# Patient Record
Sex: Female | Born: 1950 | Race: White | Hispanic: No | Marital: Married | Smoking: Never smoker
Health system: Southern US, Community
[De-identification: ages and names within clinical notes are randomized; demographics above are authoritative.]

## PROBLEM LIST (undated history)

## (undated) DIAGNOSIS — E7800 Pure hypercholesterolemia, unspecified: Secondary | ICD-10-CM

## (undated) DIAGNOSIS — Z8489 Family history of other specified conditions: Secondary | ICD-10-CM

## (undated) DIAGNOSIS — N816 Rectocele: Secondary | ICD-10-CM

## (undated) DIAGNOSIS — I447 Left bundle-branch block, unspecified: Secondary | ICD-10-CM

## (undated) DIAGNOSIS — I10 Essential (primary) hypertension: Secondary | ICD-10-CM

## (undated) DIAGNOSIS — N814 Uterovaginal prolapse, unspecified: Secondary | ICD-10-CM

## (undated) DIAGNOSIS — D219 Benign neoplasm of connective and other soft tissue, unspecified: Secondary | ICD-10-CM

## (undated) HISTORY — DX: Essential (primary) hypertension: I10

## (undated) HISTORY — DX: Pure hypercholesterolemia, unspecified: E78.00

## (undated) HISTORY — PX: VAGINAL HYSTERECTOMY: SUR661

## (undated) HISTORY — DX: Uterovaginal prolapse, unspecified: N81.4

## (undated) HISTORY — PX: BLADDER SUSPENSION: SHX72

## (undated) HISTORY — DX: Rectocele: N81.6

## (undated) HISTORY — PX: PELVIC LAPAROSCOPY: SHX162

## (undated) HISTORY — DX: Benign neoplasm of connective and other soft tissue, unspecified: D21.9

---

## 1997-09-15 ENCOUNTER — Other Ambulatory Visit: Admission: RE | Admit: 1997-09-15 | Discharge: 1997-09-15 | Payer: Self-pay | Admitting: Obstetrics and Gynecology

## 1997-10-13 ENCOUNTER — Ambulatory Visit (HOSPITAL_COMMUNITY): Admission: RE | Admit: 1997-10-13 | Discharge: 1997-10-13 | Payer: Self-pay | Admitting: Obstetrics and Gynecology

## 1999-03-08 ENCOUNTER — Ambulatory Visit (HOSPITAL_COMMUNITY): Admission: RE | Admit: 1999-03-08 | Discharge: 1999-03-08 | Payer: Self-pay | Admitting: Obstetrics and Gynecology

## 1999-03-08 ENCOUNTER — Encounter: Payer: Self-pay | Admitting: Obstetrics and Gynecology

## 1999-04-05 ENCOUNTER — Other Ambulatory Visit: Admission: RE | Admit: 1999-04-05 | Discharge: 1999-04-05 | Payer: Self-pay | Admitting: Obstetrics and Gynecology

## 1999-06-30 ENCOUNTER — Ambulatory Visit (HOSPITAL_COMMUNITY): Admission: RE | Admit: 1999-06-30 | Discharge: 1999-06-30 | Payer: Self-pay | Admitting: Cardiology

## 2000-04-17 ENCOUNTER — Other Ambulatory Visit: Admission: RE | Admit: 2000-04-17 | Discharge: 2000-04-17 | Payer: Self-pay | Admitting: Obstetrics and Gynecology

## 2000-07-25 ENCOUNTER — Ambulatory Visit (HOSPITAL_COMMUNITY): Admission: RE | Admit: 2000-07-25 | Discharge: 2000-07-25 | Payer: Self-pay | Admitting: Obstetrics and Gynecology

## 2000-07-25 ENCOUNTER — Encounter: Payer: Self-pay | Admitting: Obstetrics and Gynecology

## 2000-12-19 ENCOUNTER — Encounter: Admission: RE | Admit: 2000-12-19 | Discharge: 2001-03-19 | Payer: Self-pay | Admitting: Family Medicine

## 2001-04-17 ENCOUNTER — Emergency Department (HOSPITAL_COMMUNITY): Admission: EM | Admit: 2001-04-17 | Discharge: 2001-04-18 | Payer: Self-pay | Admitting: Emergency Medicine

## 2001-04-25 ENCOUNTER — Other Ambulatory Visit: Admission: RE | Admit: 2001-04-25 | Discharge: 2001-04-25 | Payer: Self-pay | Admitting: Obstetrics and Gynecology

## 2001-12-23 ENCOUNTER — Encounter: Payer: Self-pay | Admitting: Obstetrics and Gynecology

## 2001-12-23 ENCOUNTER — Ambulatory Visit (HOSPITAL_COMMUNITY): Admission: RE | Admit: 2001-12-23 | Discharge: 2001-12-23 | Payer: Self-pay | Admitting: Obstetrics and Gynecology

## 2002-12-23 ENCOUNTER — Encounter (INDEPENDENT_AMBULATORY_CARE_PROVIDER_SITE_OTHER): Payer: Self-pay | Admitting: *Deleted

## 2002-12-23 ENCOUNTER — Ambulatory Visit (HOSPITAL_COMMUNITY): Admission: RE | Admit: 2002-12-23 | Discharge: 2002-12-23 | Payer: Self-pay | Admitting: Gastroenterology

## 2003-02-11 ENCOUNTER — Ambulatory Visit (HOSPITAL_COMMUNITY): Admission: RE | Admit: 2003-02-11 | Discharge: 2003-02-11 | Payer: Self-pay | Admitting: Obstetrics and Gynecology

## 2003-02-11 ENCOUNTER — Other Ambulatory Visit: Admission: RE | Admit: 2003-02-11 | Discharge: 2003-02-11 | Payer: Self-pay | Admitting: Obstetrics and Gynecology

## 2004-02-22 ENCOUNTER — Other Ambulatory Visit: Admission: RE | Admit: 2004-02-22 | Discharge: 2004-02-22 | Payer: Self-pay | Admitting: Obstetrics and Gynecology

## 2004-02-24 ENCOUNTER — Ambulatory Visit (HOSPITAL_COMMUNITY): Admission: RE | Admit: 2004-02-24 | Discharge: 2004-02-24 | Payer: Self-pay | Admitting: Obstetrics and Gynecology

## 2004-03-10 ENCOUNTER — Encounter: Admission: RE | Admit: 2004-03-10 | Discharge: 2004-03-10 | Payer: Self-pay | Admitting: Obstetrics and Gynecology

## 2005-02-22 ENCOUNTER — Other Ambulatory Visit: Admission: RE | Admit: 2005-02-22 | Discharge: 2005-02-22 | Payer: Self-pay | Admitting: Obstetrics and Gynecology

## 2005-02-24 ENCOUNTER — Encounter: Admission: RE | Admit: 2005-02-24 | Discharge: 2005-02-24 | Payer: Self-pay | Admitting: Obstetrics and Gynecology

## 2006-03-16 ENCOUNTER — Other Ambulatory Visit: Admission: RE | Admit: 2006-03-16 | Discharge: 2006-03-16 | Payer: Self-pay | Admitting: Obstetrics and Gynecology

## 2006-03-29 ENCOUNTER — Encounter: Admission: RE | Admit: 2006-03-29 | Discharge: 2006-03-29 | Payer: Self-pay | Admitting: Obstetrics and Gynecology

## 2007-04-17 ENCOUNTER — Encounter: Admission: RE | Admit: 2007-04-17 | Discharge: 2007-04-17 | Payer: Self-pay | Admitting: Obstetrics and Gynecology

## 2008-04-15 ENCOUNTER — Encounter: Payer: Self-pay | Admitting: Obstetrics and Gynecology

## 2008-04-15 ENCOUNTER — Other Ambulatory Visit: Admission: RE | Admit: 2008-04-15 | Discharge: 2008-04-15 | Payer: Self-pay | Admitting: Obstetrics and Gynecology

## 2008-04-15 ENCOUNTER — Ambulatory Visit: Payer: Self-pay | Admitting: Obstetrics and Gynecology

## 2008-04-20 ENCOUNTER — Encounter: Admission: RE | Admit: 2008-04-20 | Discharge: 2008-04-20 | Payer: Self-pay | Admitting: Obstetrics and Gynecology

## 2008-05-04 ENCOUNTER — Ambulatory Visit: Payer: Self-pay | Admitting: Obstetrics and Gynecology

## 2009-04-21 ENCOUNTER — Encounter: Admission: RE | Admit: 2009-04-21 | Discharge: 2009-04-21 | Payer: Self-pay | Admitting: Obstetrics and Gynecology

## 2009-06-23 ENCOUNTER — Other Ambulatory Visit: Admission: RE | Admit: 2009-06-23 | Discharge: 2009-06-23 | Payer: Self-pay | Admitting: Obstetrics and Gynecology

## 2009-06-23 ENCOUNTER — Ambulatory Visit: Payer: Self-pay | Admitting: Obstetrics and Gynecology

## 2009-06-24 ENCOUNTER — Ambulatory Visit: Payer: Self-pay | Admitting: Obstetrics and Gynecology

## 2009-09-15 ENCOUNTER — Ambulatory Visit: Payer: Self-pay | Admitting: Obstetrics and Gynecology

## 2009-09-23 ENCOUNTER — Ambulatory Visit (HOSPITAL_BASED_OUTPATIENT_CLINIC_OR_DEPARTMENT_OTHER): Admission: RE | Admit: 2009-09-23 | Discharge: 2009-09-24 | Payer: Self-pay | Admitting: Obstetrics and Gynecology

## 2010-03-24 LAB — GLUCOSE, CAPILLARY
Glucose-Capillary: 104 mg/dL — ABNORMAL HIGH (ref 70–99)
Glucose-Capillary: 121 mg/dL — ABNORMAL HIGH (ref 70–99)
Glucose-Capillary: 138 mg/dL — ABNORMAL HIGH (ref 70–99)
Glucose-Capillary: 152 mg/dL — ABNORMAL HIGH (ref 70–99)

## 2010-03-24 LAB — POCT I-STAT 4, (NA,K, GLUC, HGB,HCT)
Glucose, Bld: 131 mg/dL — ABNORMAL HIGH (ref 70–99)
HCT: 41 % (ref 36.0–46.0)
Hemoglobin: 13.9 g/dL (ref 12.0–15.0)
Potassium: 4 mEq/L (ref 3.5–5.1)
Sodium: 142 mEq/L (ref 135–145)

## 2010-05-27 NOTE — Cardiovascular Report (Signed)
[place]. [facility]  Patient:    Angela Choi, Angela Choi                        MRN: [id number] Proc. Date: 06/30/99 Adm. Date:  [id number] Disc. Date: [id number] Attending:  [place], Peter Manning CC:         Gabriel Earing, M.D.             Cath Lab and Medical records                        Cardiac Catheterization  PROCEDURE:  Cardiac catheterization.  INDICATION:  The patient is a 60 year old white female with a history of hypercholesterolemia and family history of coronary disease.  She had some atypical chest tightness.  Resting ECG was abnormal and subsequent stress testing demonstrated intermittent left bundle branch block.  She had some evidence of anterior ischemia.  Access via the right femoral artery using standard Seldinger technique.  INSTRUMENTS:  A 6 French 4 cm right and left Judkins catheter, a 6 French pigtail catheter, 6 French arterial sheath.  PREMEDICATION:  Local anesthesia with 1% Xylocaine, Versed 2 mg IV.  CONTRAST:  150 cc of Omnipaque.  HEMODYNAMIC DATA: 1. Aortic pressure is 131/81 with a mean of 104 mmHg. 2. Left ventricular pressure is 133 with an EDP of 20 mmHg.  ANGIOGRAPHIC DATA: 1. Left coronary artery rises in a dominant fashion. 2. Left main coronary artery is normal. 3. Left anterior descending coronary artery is a large vessel normal. 4. Left circumflex coronary artery is a large dominant vessel and is also    normal. 5. The right coronary artery is a small nondominant vessel and is normal.  LEFT VENTRICULAR ANGIOGRAPHY:  Left ventricular angiography was performed in the RAO view.  Left ventricle was small in size with normal contractility. Ejection fraction is estimated at 70%.  There is no significant mitral insufficiency or prolapse.  FINAL INTERPRETATION: 1. Normal coronary anatomy. 2. Normal left ventricular function. DD:  06/30/99 TD:  07/03/99 Job: [id number]

## 2010-05-27 NOTE — Op Note (Signed)
NAME:  Angela Choi, Angela Choi [facility]                         ACCOUNT NO.:  [id number]   MEDICAL RECORD NO.:  [id number]                   PATIENT TYPE:  AMB   LOCATION:  ENDO                                 FACILITY:  [facility]   PHYSICIAN:  Graylin Shiver, M.D.                DATE OF BIRTH:  22-Aug-1950   DATE OF PROCEDURE:  12/23/2002  DATE OF DISCHARGE:                                 OPERATIVE REPORT   PROCEDURE:  Colonoscopy with biopsy.   INDICATIONS:  Screening.   Informed consent was obtained after explanation of the risks of bleeding,  infection, and perforation.   PREMEDICATION:  Fentanyl 90 mcg IV, Versed 9 mg IV.   DESCRIPTION OF PROCEDURE:  With the patient in the left lateral decubitus  position, a rectal exam was performed and no masses were felt.  The Olympus  colonoscope was inserted into the rectum and advanced around the colon to  the cecum.  Cecal landmarks were identified.  The cecum and ascending colon  were normal.  The transverse colon was normal.  The descending colon was  normal.  In the sigmoid there was a small 3 mm sessile polyp removed with  cold forceps.  In the rectum there were two small 3 mm polyps removed with  cold forceps.  She tolerated the procedure well without complications.   IMPRESSION:  Small polyps in the rectum and sigmoid.   PLAN:  Pathology will be checked.                                               Graylin Shiver, M.D.    SFG/MEDQ  D:  12/23/2002  T:  12/23/2002  Job:  [id number]   cc:   Chales Salmon. Abigail Miyamoto, M.D.  [address]  [place]  [place] [address]  Fax: [phone]

## 2010-06-15 ENCOUNTER — Other Ambulatory Visit: Payer: Self-pay | Admitting: Obstetrics and Gynecology

## 2010-06-15 DIAGNOSIS — Z1231 Encounter for screening mammogram for malignant neoplasm of breast: Secondary | ICD-10-CM

## 2010-06-22 ENCOUNTER — Ambulatory Visit
Admission: RE | Admit: 2010-06-22 | Discharge: 2010-06-22 | Disposition: A | Discharge status: Home / Self Care | Payer: 59 | Source: Ambulatory Visit | Attending: Obstetrics and Gynecology | Admitting: Obstetrics and Gynecology

## 2010-06-22 DIAGNOSIS — Z1231 Encounter for screening mammogram for malignant neoplasm of breast: Secondary | ICD-10-CM

## 2010-06-27 ENCOUNTER — Other Ambulatory Visit (HOSPITAL_COMMUNITY)
Admission: RE | Admit: 2010-06-27 | Discharge: 2010-06-27 | Disposition: A | Discharge status: Home / Self Care | Payer: 59 | Source: Ambulatory Visit | Attending: Obstetrics and Gynecology | Admitting: Obstetrics and Gynecology

## 2010-06-27 ENCOUNTER — Other Ambulatory Visit: Payer: Self-pay | Admitting: Obstetrics and Gynecology

## 2010-06-27 ENCOUNTER — Encounter (INDEPENDENT_AMBULATORY_CARE_PROVIDER_SITE_OTHER): Payer: 59 | Admitting: Obstetrics and Gynecology

## 2010-06-27 DIAGNOSIS — R82998 Other abnormal findings in urine: Secondary | ICD-10-CM

## 2010-06-27 DIAGNOSIS — Z01419 Encounter for gynecological examination (general) (routine) without abnormal findings: Secondary | ICD-10-CM

## 2010-06-27 DIAGNOSIS — Z124 Encounter for screening for malignant neoplasm of cervix: Secondary | ICD-10-CM | POA: Insufficient documentation

## 2010-10-20 ENCOUNTER — Other Ambulatory Visit: Payer: Self-pay | Admitting: *Deleted

## 2010-10-20 MED ORDER — ESTRADIOL 0.05 MG/24HR TD PTWK: 1.0000 | MEDICATED_PATCH | TRANSDERMAL | Status: DC

## 2010-10-20 NOTE — Telephone Encounter (Signed)
Patient requested 90 day rx for generic Climara 0.05 patch.  [facility]

## 2010-12-12 ENCOUNTER — Other Ambulatory Visit: Payer: Self-pay | Admitting: Dermatology

## 2011-06-08 ENCOUNTER — Other Ambulatory Visit: Payer: Self-pay | Admitting: Obstetrics and Gynecology

## 2011-06-08 DIAGNOSIS — Z1231 Encounter for screening mammogram for malignant neoplasm of breast: Secondary | ICD-10-CM

## 2011-06-20 ENCOUNTER — Encounter: Payer: Self-pay | Admitting: Gynecology

## 2011-06-20 DIAGNOSIS — N816 Rectocele: Secondary | ICD-10-CM | POA: Insufficient documentation

## 2011-06-20 DIAGNOSIS — N814 Uterovaginal prolapse, unspecified: Secondary | ICD-10-CM | POA: Insufficient documentation

## 2011-06-20 DIAGNOSIS — I10 Essential (primary) hypertension: Secondary | ICD-10-CM | POA: Insufficient documentation

## 2011-06-20 DIAGNOSIS — E7800 Pure hypercholesterolemia, unspecified: Secondary | ICD-10-CM | POA: Insufficient documentation

## 2011-06-20 DIAGNOSIS — E119 Type 2 diabetes mellitus without complications: Secondary | ICD-10-CM | POA: Insufficient documentation

## 2011-06-20 DIAGNOSIS — N809 Endometriosis, unspecified: Secondary | ICD-10-CM | POA: Insufficient documentation

## 2011-06-20 DIAGNOSIS — D219 Benign neoplasm of connective and other soft tissue, unspecified: Secondary | ICD-10-CM | POA: Insufficient documentation

## 2011-06-28 ENCOUNTER — Ambulatory Visit (INDEPENDENT_AMBULATORY_CARE_PROVIDER_SITE_OTHER): Payer: BC Managed Care – PPO | Admitting: Obstetrics and Gynecology

## 2011-06-28 ENCOUNTER — Ambulatory Visit
Admission: RE | Admit: 2011-06-28 | Discharge: 2011-06-28 | Disposition: A | Discharge status: Home / Self Care | Payer: BC Managed Care – PPO | Source: Ambulatory Visit | Attending: Obstetrics and Gynecology | Admitting: Obstetrics and Gynecology

## 2011-06-28 ENCOUNTER — Encounter: Payer: Self-pay | Admitting: Obstetrics and Gynecology

## 2011-06-28 VITALS — BP 138/90 | Ht 65.0 in | Wt 189.0 lb

## 2011-06-28 DIAGNOSIS — Z1231 Encounter for screening mammogram for malignant neoplasm of breast: Secondary | ICD-10-CM

## 2011-06-28 DIAGNOSIS — Z01419 Encounter for gynecological examination (general) (routine) without abnormal findings: Secondary | ICD-10-CM

## 2011-06-28 MED ORDER — ESTRADIOL 0.05 MG/24HR TD PTWK: 1.0000 | MEDICATED_PATCH | TRANSDERMAL | Status: DC

## 2011-06-28 NOTE — Progress Notes (Signed)
Patient came to see me today for her annual GYN exam. She has scheduled her mammogram. She has had 2 normal bone densities. She remains on a Climara patch which is helping her hot flashes. She is having no vaginal bleeding. She is having no pelvic pain. She just has occasional stress incontinence since she had her sling procedure. She does her lab with her PCP. In 1982 she had cervical dysplasia which was observed and resolved. She is had normal Paps since then. Her last Pap was in June of 2012. She has had a vaginal hysterectomy for uterine prolapse,  Fibroids,  and endometriosis. She has both her ovaries.  HEENT: Within normal limits. Blanca present. Neck: No masses. Supraclavicular lymph nodes: Not enlarged. Breasts: Examined in both sitting and lying position. Symmetrical without skin changes or masses. Abdomen: Soft no masses guarding or rebound. No hernias. Pelvic: External within normal limits. BUS within normal limits. Vaginal examination shows good estrogen effect, no cystocele enterocele or rectocele. Her sling is palpable on the anterior wall vagina. Cervix and uterus absent. Adnexa within normal limits. Rectovaginal confirmatory. Extremities within normal limits.  Assessment: Vasomotor symptoms. History of CIN  in 1982.  Plan: Mammogram. Continue Climara patch. Discussed with her that new guidelines suggest she does not need any more Pap smears. No Pap done today.

## 2011-07-04 ENCOUNTER — Encounter: Payer: Self-pay | Admitting: Obstetrics and Gynecology

## 2012-06-13 ENCOUNTER — Other Ambulatory Visit: Payer: Self-pay

## 2012-06-13 DIAGNOSIS — Z1231 Encounter for screening mammogram for malignant neoplasm of breast: Secondary | ICD-10-CM

## 2012-07-01 ENCOUNTER — Other Ambulatory Visit: Payer: Self-pay | Admitting: Obstetrics and Gynecology

## 2012-07-03 ENCOUNTER — Ambulatory Visit (INDEPENDENT_AMBULATORY_CARE_PROVIDER_SITE_OTHER): Payer: BC Managed Care – PPO | Admitting: Gynecology

## 2012-07-03 ENCOUNTER — Encounter: Payer: Self-pay | Admitting: Gynecology

## 2012-07-03 VITALS — BP 138/78 | Ht 65.25 in | Wt 178.0 lb

## 2012-07-03 DIAGNOSIS — N951 Menopausal and female climacteric states: Secondary | ICD-10-CM

## 2012-07-03 DIAGNOSIS — Z780 Asymptomatic menopausal state: Secondary | ICD-10-CM

## 2012-07-03 DIAGNOSIS — Z01419 Encounter for gynecological examination (general) (routine) without abnormal findings: Secondary | ICD-10-CM

## 2012-07-03 DIAGNOSIS — Z79890 Hormone replacement therapy (postmenopausal): Secondary | ICD-10-CM

## 2012-07-03 MED ORDER — ESTRADIOL 0.05 MG/24HR TD PTWK: 0.0500 | MEDICATED_PATCH | TRANSDERMAL | Status: DC

## 2012-07-03 NOTE — Progress Notes (Signed)
Angela Choi 11/15/1950 [id number]   History:    62 y.o.  for annual gyn exam no complaints. The patient has been on transdermal estrogen consisting of Climara patch 0.05 mg/24-hour.  Patient with past history of transvaginal hysterectomy with posterior repair in 1995 for uterine prolapse and fibroid uterus. In 2011 she had a suburethral sling placed as well. She denied any urinary incontinence today. The patient had cervical dysplasia in 1982 and subsequent Pap smears have been normal since then. She also has a history of benign colon polyps in the past and her last colonoscopy was in 2010. She is overdue for mammogram which she is scheduled for the next 2 weeks. She frequent does her breast exams. She states that her primary physician Dr. Sigmund Hazel has been doing her blood work and that her shingles vaccine, Pneumovax vaccine antidiabetic seen are all up-to-date. She is exercising regularly and better diet and has lost 11 pounds since last year.  Past medical history,surgical history, family history and social history were all reviewed and documented in the EPIC chart.  Gynecologic History No LMP recorded. Patient has had a hysterectomy. Contraception: status post hysterectomy Last Pap: 2012. Results were: normal Last mammogram: 2013. Results were: normal  Obstetric History OB History   Grav Para Term Preterm Abortions TAB SAB Ect Mult Living   2 2 2       2      # Outc Date GA Lbr Len/2nd Wgt Sex Del Anes PTL Lv   1 TRM            2 TRM                ROS: A ROS was performed and pertinent positives and negatives are included in the history.  GENERAL: No fevers or chills. HEENT: No change in vision, no earache, sore throat or sinus congestion. NECK: No pain or stiffness. CARDIOVASCULAR: No chest pain or pressure. No palpitations. PULMONARY: No shortness of breath, cough or wheeze. GASTROINTESTINAL: No abdominal pain, nausea, vomiting or diarrhea, melena or bright red blood per rectum.  GENITOURINARY: No urinary frequency, urgency, hesitancy or dysuria. MUSCULOSKELETAL: No joint or muscle pain, no back pain, no recent trauma. DERMATOLOGIC: No rash, no itching, no lesions. ENDOCRINE: No polyuria, polydipsia, no heat or cold intolerance. No recent change in weight. HEMATOLOGICAL: No anemia or easy bruising or bleeding. NEUROLOGIC: No headache, seizures, numbness, tingling or weakness. PSYCHIATRIC: No depression, no loss of interest in normal activity or change in sleep pattern.     Exam: chaperone present  BP 138/78  Ht 5' 5.25" (1.657 m)  Wt 178 lb (80.74 kg)  BMI 29.41 kg/m2  Body mass index is 29.41 kg/(m^2).  General appearance : Well developed well nourished female. No acute distress HEENT: Neck supple, trachea midline, no carotid bruits, no thyroidmegaly Lungs: Clear to auscultation, no rhonchi or wheezes, or rib retractions  Heart: Regular rate and rhythm, no murmurs or gallops Breast:Examined in sitting and supine position were symmetrical in appearance, no palpable masses or tenderness,  no skin retraction, no nipple inversion, no nipple discharge, no skin discoloration, no axillary or supraclavicular lymphadenopathy Abdomen: no palpable masses or tenderness, no rebound or guarding Extremities: no edema or skin discoloration or tenderness  Pelvic:  Bartholin, Urethra, Skene Glands: Within normal limits             Vagina: No gross lesions or discharge  Cervix:absent  Uterus absent  Adnexa  Without masses or tenderness  Anus and  perineum  normal   Rectovaginal  normal sphincter tone without palpated masses or tenderness             Hemoccult course provided     Assessment/Plan:  62 y.o. female for annual exam doing well on Climara patch 0.05 mg q. Weekly refill provided. Patient has been on HRT less than 10 years. We discussed the women's health initiative study and the risk benefits and pros and cons of hormone replacement therapy. She was reminded to  followup with her schedule mammogram and to continue to do her monthly breast exams. We discussed importance of calcium and vitamin D for osteoporosis prevention. She will schedule her bone density study here in our office the next 2 weeks. She was reminded to submit to the office Hemoccult card for testing. No Pap smear done today the new guidelines were discussed.    Ok Edwards MD, 5:28 PM 07/03/2012

## 2012-07-03 NOTE — Patient Instructions (Addendum)
Bone Densitometry Bone densitometry is a special X-ray that measures your bone density and can be used to help predict your risk of bone fractures. This test is used to determine bone mineral content and density to diagnose osteoporosis. Osteoporosis is the loss of bone that may cause the bone to become weak. Osteoporosis commonly occurs in women entering menopause. However, it may be found in men and in people with other diseases. PREPARATION FOR TEST No preparation necessary. WHO SHOULD BE TESTED?  All women older than 65.  Postmenopausal women (50 to 65) with risk factors for osteoporosis.  People with a previous fracture caused by normal activities.  People with a small body frame (less than 127 poundsor a body mass index [BMI] of less than 21).  People who have a parent with a hip fracture or history of osteoporosis.  People who smoke.  People who have rheumatoid arthritis.  Anyone who engages in excessive alcohol use (more than 3 drinks most days).  Women who experience early menopause. WHEN SHOULD YOU BE RETESTED? Current guidelines suggest that you should wait at least 2 years before doing a bone density test again if your first test was normal.Recent studies indicated that women with normal bone density may be able to wait a few years before needing to repeat a bone density test. You should discuss this with your caregiver.  NORMAL FINDINGS   Normal: less than standard deviation below normal (greater than -1).  Osteopenia: 1 to 2.5 standard deviations below normal (-1 to -2.5).  Osteoporosis: greater than 2.5 standard deviations below normal (less than -2.5). Test results are reported as a "T score" and a "Z score."The T score is a number that compares your bone density with the bone density of healthy, young women.The Z score is a number that compares your bone density with the scores of women who are the same age, gender, and race.  Ranges for normal findings may vary  among different laboratories and hospitals. You should always check with your doctor after having lab work or other tests done to discuss the meaning of your test results and whether your values are considered within normal limits. MEANING OF TEST  Your caregiver will go over the test results with you and discuss the importance and meaning of your results, as well as treatment options and the need for additional tests if necessary. OBTAINING THE TEST RESULTS It is your responsibility to obtain your test results. Ask the lab or department performing the test when and how you will get your results. Document Released: 01/18/2004 Document Revised: 03/20/2011 Document Reviewed: 02/09/2010 [employer] Patient Information 2014 [employer].  

## 2012-07-04 ENCOUNTER — Encounter: Payer: Self-pay | Admitting: Obstetrics and Gynecology

## 2012-07-08 ENCOUNTER — Other Ambulatory Visit: Payer: Self-pay | Admitting: Anesthesiology

## 2012-07-08 DIAGNOSIS — Z1211 Encounter for screening for malignant neoplasm of colon: Secondary | ICD-10-CM

## 2012-07-09 ENCOUNTER — Ambulatory Visit
Admission: RE | Admit: 2012-07-09 | Discharge: 2012-07-09 | Disposition: A | Discharge status: Home / Self Care | Payer: BC Managed Care – PPO | Source: Ambulatory Visit

## 2012-07-09 DIAGNOSIS — Z1231 Encounter for screening mammogram for malignant neoplasm of breast: Secondary | ICD-10-CM

## 2012-07-16 ENCOUNTER — Encounter: Payer: Self-pay | Admitting: Obstetrics and Gynecology

## 2012-08-08 ENCOUNTER — Ambulatory Visit (INDEPENDENT_AMBULATORY_CARE_PROVIDER_SITE_OTHER): Payer: BC Managed Care – PPO

## 2012-08-08 DIAGNOSIS — Z79890 Hormone replacement therapy (postmenopausal): Secondary | ICD-10-CM

## 2012-08-08 DIAGNOSIS — Z780 Asymptomatic menopausal state: Secondary | ICD-10-CM

## 2012-08-08 DIAGNOSIS — Z13820 Encounter for screening for osteoporosis: Secondary | ICD-10-CM

## 2013-02-04 ENCOUNTER — Telehealth: Payer: Self-pay | Admitting: *Deleted

## 2013-02-04 MED ORDER — ESTRADIOL 0.05 MG/24HR TD PTWK: 0.0500 mg | MEDICATED_PATCH | TRANSDERMAL | Status: DC

## 2013-02-04 NOTE — Telephone Encounter (Signed)
Pt has new mail order pharmacy and would like refill climara patch sent there. 3 month supply with 1 refill sent.

## 2013-06-04 ENCOUNTER — Other Ambulatory Visit: Payer: Self-pay

## 2013-06-04 DIAGNOSIS — Z1231 Encounter for screening mammogram for malignant neoplasm of breast: Secondary | ICD-10-CM

## 2013-07-04 ENCOUNTER — Encounter: Payer: Self-pay | Admitting: Gynecology

## 2013-07-04 ENCOUNTER — Ambulatory Visit (INDEPENDENT_AMBULATORY_CARE_PROVIDER_SITE_OTHER): Payer: BC Managed Care – PPO | Admitting: Gynecology

## 2013-07-04 VITALS — BP 130/78 | Ht 65.5 in | Wt 181.0 lb

## 2013-07-04 DIAGNOSIS — Z79890 Hormone replacement therapy (postmenopausal): Secondary | ICD-10-CM

## 2013-07-04 DIAGNOSIS — Z01419 Encounter for gynecological examination (general) (routine) without abnormal findings: Secondary | ICD-10-CM

## 2013-07-04 MED ORDER — ESTRADIOL 0.05 MG/24HR TD PTWK: 0.0025 mg | MEDICATED_PATCH | TRANSDERMAL | Status: DC

## 2013-07-04 NOTE — Progress Notes (Signed)
Angela Choi [birth year] [id number]   History:    63 y.o.  for annual gyn exam. The patient has been on transdermal estrogen consisting of Climara patch 0.05 mg/24-hour. Patient with past history of transvaginal hysterectomy with posterior repair in 1995 for uterine prolapse and fibroid uterus. In 2011 she had a suburethral sling placed as well. She denied any urinary incontinence today. The patient had cervical dysplasia in 1982 and subsequent Pap smears have been normal since then. She also has a history of benign colon polyps in the past and her last colonoscopy was in 2010. She reports having a normal colonoscopy in 2015 as well as her mammogram. Her last bone density study was normal in 2014. Patient had a normal mammogram in 2012. Her Pneumovax vaccine she was not seen her up-to-date.  Past medical history,surgical history, family history and social history were all reviewed and documented in the EPIC chart.  Gynecologic History No LMP recorded. Patient has had a hysterectomy. Contraception: status post hysterectomy Last Pap: 2012. Results were: normal Last mammogram: 2015. Results were: normal  Obstetric History OB History  Gravida Para Term Preterm AB SAB TAB Ectopic Multiple Living  2 2 2       2     # Outcome Date GA Lbr Len/2nd Weight Sex Delivery Anes PTL Lv  2 TRM           1 TRM                ROS: A ROS was performed and pertinent positives and negatives are included in the history.  GENERAL: No fevers or chills. HEENT: No change in vision, no earache, sore throat or sinus congestion. NECK: No pain or stiffness. CARDIOVASCULAR: No chest pain or pressure. No palpitations. PULMONARY: No shortness of breath, cough or wheeze. GASTROINTESTINAL: No abdominal pain, nausea, vomiting or diarrhea, melena or bright red blood per rectum. GENITOURINARY: No urinary frequency, urgency, hesitancy or dysuria. MUSCULOSKELETAL: No joint or muscle pain, no back pain, no recent trauma.  DERMATOLOGIC: No rash, no itching, no lesions. ENDOCRINE: No polyuria, polydipsia, no heat or cold intolerance. No recent change in weight. HEMATOLOGICAL: No anemia or easy bruising or bleeding. NEUROLOGIC: No headache, seizures, numbness, tingling or weakness. PSYCHIATRIC: No depression, no loss of interest in normal activity or change in sleep pattern.     Exam: chaperone present  BP 130/78  Ht 5' 5.5" (1.664 m)  Wt 181 lb (82.101 kg)  BMI 29.65 kg/m2  Body mass index is 29.65 kg/(m^2).  General appearance : Well developed well nourished female. No acute distress HEENT: Neck supple, trachea midline, no carotid bruits, no thyroidmegaly Lungs: Clear to auscultation, no rhonchi or wheezes, or rib retractions  Heart: Regular rate and rhythm, no murmurs or gallops Breast:Examined in sitting and supine position were symmetrical in appearance, no palpable masses or tenderness,  no skin retraction, no nipple inversion, no nipple discharge, no skin discoloration, no axillary or supraclavicular lymphadenopathy Abdomen: no palpable masses or tenderness, no rebound or guarding Extremities: no edema or skin discoloration or tenderness  Pelvic:  Bartholin, Urethra, Skene Glands: Within normal limits             Vagina: No gross lesions or discharge  Cervix: Absent  Uterus  absent  Adnexa  Without masses or tenderness  Anus and perineum  normal   Rectovaginal  normal sphincter tone without palpated masses or tenderness             Hemoccult  colonoscopy this year     Assessment/Plan:  63 y.o. female for annual exam doing well on her Climara transdermal patch for vasomotor symptoms. She was reminded of the importance of monthly breast exam. Pap smear not done in accordance to the new guidelines. Surgery none the importance of calcium vitamin D and regular exercise for osteoporosis prevention. We will see her back in one year or when necessary.  Note: This dictation was prepared with   Dragon/digital dictation along withSmart phrase technology. Any transcriptional errors that result from this process are unintentional.   Terrance Mass MD, 6:21 PM 07/04/2013

## 2013-07-04 NOTE — Patient Instructions (Signed)

## 2013-07-14 ENCOUNTER — Ambulatory Visit
Admission: RE | Admit: 2013-07-14 | Discharge: 2013-07-14 | Disposition: A | Discharge status: Home / Self Care | Payer: BC Managed Care – PPO | Source: Ambulatory Visit

## 2013-07-14 DIAGNOSIS — Z1231 Encounter for screening mammogram for malignant neoplasm of breast: Secondary | ICD-10-CM

## 2013-08-14 ENCOUNTER — Other Ambulatory Visit: Payer: Self-pay | Admitting: Gastroenterology

## 2013-09-19 ENCOUNTER — Other Ambulatory Visit: Payer: Self-pay

## 2013-09-19 MED ORDER — ESTRADIOL 0.05 MG/24HR TD PTWK: 0.0500 mg | MEDICATED_PATCH | TRANSDERMAL | Status: DC

## 2013-11-10 ENCOUNTER — Encounter: Payer: Self-pay | Admitting: Gynecology

## 2013-11-11 ENCOUNTER — Emergency Department (HOSPITAL_COMMUNITY)
Admission: EM | Admit: 2013-11-11 | Discharge: 2013-11-11 | Discharge status: Absent without leave | Payer: BC Managed Care – PPO | Source: Home / Self Care

## 2014-01-20 ENCOUNTER — Telehealth: Payer: Self-pay | Admitting: *Deleted

## 2014-01-20 NOTE — Telephone Encounter (Signed)
Pt called requesting to have her climara 0.05 mg patch resent to mail order [facility] in [place] [phone], I called this number and no answer at this number.I called the [facility] # in epic that Rx was sent to in June 2015 and spoke with pharmacy and pt has 4 refills there. I called pt back and left on her voicemail that refill are at pharmacy and to call them to refill. That the [phone] number didn't work.

## 2014-06-09 ENCOUNTER — Other Ambulatory Visit: Payer: Self-pay

## 2014-06-09 DIAGNOSIS — Z1231 Encounter for screening mammogram for malignant neoplasm of breast: Secondary | ICD-10-CM

## 2014-07-16 ENCOUNTER — Ambulatory Visit
Admission: RE | Admit: 2014-07-16 | Discharge: 2014-07-16 | Disposition: A | Discharge status: Home / Self Care | Payer: BLUE CROSS/BLUE SHIELD | Source: Ambulatory Visit

## 2014-07-16 DIAGNOSIS — Z1231 Encounter for screening mammogram for malignant neoplasm of breast: Secondary | ICD-10-CM

## 2014-07-20 ENCOUNTER — Encounter: Payer: Self-pay | Admitting: Gynecology

## 2014-07-20 ENCOUNTER — Ambulatory Visit (INDEPENDENT_AMBULATORY_CARE_PROVIDER_SITE_OTHER): Payer: BLUE CROSS/BLUE SHIELD | Admitting: Gynecology

## 2014-07-20 VITALS — BP 138/90 | Ht 65.5 in | Wt 180.0 lb

## 2014-07-20 DIAGNOSIS — Z79890 Hormone replacement therapy (postmenopausal): Secondary | ICD-10-CM | POA: Diagnosis not present

## 2014-07-20 DIAGNOSIS — Z01419 Encounter for gynecological examination (general) (routine) without abnormal findings: Secondary | ICD-10-CM | POA: Diagnosis not present

## 2014-07-20 DIAGNOSIS — N951 Menopausal and female climacteric states: Secondary | ICD-10-CM | POA: Diagnosis not present

## 2014-07-20 MED ORDER — ESTRADIOL 0.05 MG/24HR TD PTWK: 0.0500 mg | MEDICATED_PATCH | TRANSDERMAL | Status: DC

## 2014-07-20 NOTE — Progress Notes (Signed)
Angela Choi [birth year] [id number]   History:    64 y.o.  for annual gyn exam with worsening vasomotor symptoms consisting of hot flashes, irritability and mood swings since she took herself off her transdermal Climara patch since her PCP had recommended her. He had recommended her to use blackcohosh instead which she never began. Review of her record indicated that patient had been on transdermal estrogen consisting of Climara patch 0.05 mg/24-hour. Patient with past history of transvaginal hysterectomy with posterior repair in 1995 for uterine prolapse and fibroid uterus. In 2011 she had a suburethral sling placed as well. She denied any urinary incontinence today. The patient had cervical dysplasia in 1982 and subsequent Pap smears have been normal since then. She also has a history of benign colon polyps in the past and her last colonoscopy was in 2015 and was normal. Her last bone density study was in 2014 and was normal. Her vaccines are up-to-date and her PCP is Dr. Marlowe Sax who is been doing her blood work.  Past medical history,surgical history, family history and social history were all reviewed and documented in the EPIC chart.  Gynecologic History No LMP recorded. Patient has had a hysterectomy. Contraception: post menopausal status Last Pap: 2012. Results were: normal Last mammogram: 2016. Results were: normal  Obstetric History OB History  Gravida Para Term Preterm AB SAB TAB Ectopic Multiple Living  2 2 2       2     # Outcome Date GA Lbr Len/2nd Weight Sex Delivery Anes PTL Lv  2 Term           1 Term                ROS: A ROS was performed and pertinent positives and negatives are included in the history.  GENERAL: No fevers or chills. HEENT: No change in vision, no earache, sore throat or sinus congestion. NECK: No pain or stiffness. CARDIOVASCULAR: No chest pain or pressure. No palpitations. PULMONARY: No shortness of breath, cough or wheeze. GASTROINTESTINAL:  No abdominal pain, nausea, vomiting or diarrhea, melena or bright red blood per rectum. GENITOURINARY: No urinary frequency, urgency, hesitancy or dysuria. MUSCULOSKELETAL: No joint or muscle pain, no back pain, no recent trauma. DERMATOLOGIC: No rash, no itching, no lesions. ENDOCRINE: No polyuria, polydipsia, no heat or cold intolerance. No recent change in weight. HEMATOLOGICAL: No anemia or easy bruising or bleeding. NEUROLOGIC: No headache, seizures, numbness, tingling or weakness. PSYCHIATRIC: No depression, no loss of interest in normal activity or change in sleep pattern.     Exam: chaperone present  BP 138/90 mmHg  Ht 5' 5.5" (1.664 m)  Wt 180 lb (81.647 kg)  BMI 29.49 kg/m2  Body mass index is 29.49 kg/(m^2).  General appearance : Well developed well nourished female. No acute distress HEENT: Eyes: no retinal hemorrhage or exudates,  Neck supple, trachea midline, no carotid bruits, no thyroidmegaly Lungs: Clear to auscultation, no rhonchi or wheezes, or rib retractions  Heart: Regular rate and rhythm, no murmurs or gallops Breast:Examined in sitting and supine position were symmetrical in appearance, no palpable masses or tenderness,  no skin retraction, no nipple inversion, no nipple discharge, no skin discoloration, no axillary or supraclavicular lymphadenopathy Abdomen: no palpable masses or tenderness, no rebound or guarding Extremities: no edema or skin discoloration or tenderness  Pelvic:  Bartholin, Urethra, Skene Glands: Within normal limits             Vagina: No gross lesions or  discharge  Cervix: Absent  Uterus absent  Adnexa  Without masses or tenderness  Anus and perineum  normal   Rectovaginal  normal sphincter tone without palpated masses or tenderness             Hemoccult PCP provides     Assessment/Plan:  64 y.o. female for annual exam with worsening vasomotor symptoms had been on transdermal patch for less than 7 years. We 1 skin discussed women's health  initiative study. She would like to stay on it for a few more years before she comes off of it. We discussed also Relizen which is a natural supplement that helps with vasomotor symptoms that she may want to try and then taper off her Climara patch either this year or next year. We discussed importance of calcium vitamin D and regular exercise for osteoporosis prevention. She will schedule for her bone density next few months. We also discussed importance of monthly breast exam. Pap smear no longer indicated according to the new guidelines.    Terrance Mass MD, 3:25 PM 07/20/2014

## 2014-07-20 NOTE — Patient Instructions (Signed)

## 2014-08-18 ENCOUNTER — Ambulatory Visit (INDEPENDENT_AMBULATORY_CARE_PROVIDER_SITE_OTHER): Payer: BLUE CROSS/BLUE SHIELD

## 2014-08-18 ENCOUNTER — Other Ambulatory Visit: Payer: Self-pay | Admitting: Gynecology

## 2014-08-18 DIAGNOSIS — N951 Menopausal and female climacteric states: Secondary | ICD-10-CM | POA: Diagnosis not present

## 2014-08-18 DIAGNOSIS — Z13820 Encounter for screening for osteoporosis: Secondary | ICD-10-CM | POA: Diagnosis not present

## 2014-08-18 DIAGNOSIS — Z79890 Hormone replacement therapy (postmenopausal): Secondary | ICD-10-CM | POA: Diagnosis not present

## 2014-11-26 ENCOUNTER — Emergency Department (HOSPITAL_COMMUNITY): Payer: BLUE CROSS/BLUE SHIELD

## 2014-11-26 ENCOUNTER — Encounter (HOSPITAL_COMMUNITY): Payer: Self-pay | Admitting: Emergency Medicine

## 2014-11-26 ENCOUNTER — Emergency Department (HOSPITAL_COMMUNITY)
Admission: EM | Admit: 2014-11-26 | Discharge: 2014-11-27 | Disposition: A | Discharge status: Home / Self Care | Payer: BLUE CROSS/BLUE SHIELD | Attending: Emergency Medicine | Admitting: Emergency Medicine

## 2014-11-26 DIAGNOSIS — Z79899 Other long term (current) drug therapy: Secondary | ICD-10-CM | POA: Diagnosis not present

## 2014-11-26 DIAGNOSIS — Z86018 Personal history of other benign neoplasm: Secondary | ICD-10-CM | POA: Diagnosis not present

## 2014-11-26 DIAGNOSIS — E7800 Pure hypercholesterolemia, unspecified: Secondary | ICD-10-CM | POA: Diagnosis not present

## 2014-11-26 DIAGNOSIS — Z7982 Long term (current) use of aspirin: Secondary | ICD-10-CM | POA: Diagnosis not present

## 2014-11-26 DIAGNOSIS — Z8742 Personal history of other diseases of the female genital tract: Secondary | ICD-10-CM | POA: Insufficient documentation

## 2014-11-26 DIAGNOSIS — I10 Essential (primary) hypertension: Secondary | ICD-10-CM | POA: Insufficient documentation

## 2014-11-26 DIAGNOSIS — R0789 Other chest pain: Secondary | ICD-10-CM | POA: Diagnosis not present

## 2014-11-26 DIAGNOSIS — Z7984 Long term (current) use of oral hypoglycemic drugs: Secondary | ICD-10-CM | POA: Diagnosis not present

## 2014-11-26 DIAGNOSIS — E119 Type 2 diabetes mellitus without complications: Secondary | ICD-10-CM | POA: Insufficient documentation

## 2014-11-26 DIAGNOSIS — R079 Chest pain, unspecified: Secondary | ICD-10-CM | POA: Diagnosis not present

## 2014-11-26 HISTORY — DX: Left bundle-branch block, unspecified: I44.7

## 2014-11-26 LAB — BASIC METABOLIC PANEL
Anion gap: 9 (ref 5–15)
BUN: 12 mg/dL (ref 6–20)
CO2: 27 mmol/L (ref 22–32)
Calcium: 9.7 mg/dL (ref 8.9–10.3)
Chloride: 104 mmol/L (ref 101–111)
Creatinine, Ser: 0.68 mg/dL (ref 0.44–1.00)
GFR calc Af Amer: >60 mL/min (ref >60)
GFR calc non Af Amer: >60 mL/min (ref >60)
Glucose, Bld: 136 mg/dL — ABNORMAL HIGH (ref 65–99)
Potassium: 4.6 mmol/L (ref 3.5–5.1)
Sodium: 140 mmol/L (ref 135–145)

## 2014-11-26 LAB — CBC
HCT: 39.2 % (ref 36.0–46.0)
Hemoglobin: 12.9 g/dL (ref 12.0–15.0)
MCH: 28.4 pg (ref 26.0–34.0)
MCHC: 32.9 g/dL (ref 30.0–36.0)
MCV: 86.2 fL (ref 78.0–100.0)
Platelets: 342 10*3/uL (ref 150–400)
RBC: 4.55 MIL/uL (ref 3.87–5.11)
RDW: 13.5 % (ref 11.5–15.5)
WBC: 8.3 10*3/uL (ref 4.0–10.5)

## 2014-11-26 LAB — I-STAT TROPONIN, ED: Troponin i, poc: 0 ng/mL (ref 0.00–0.08)

## 2014-11-26 NOTE — ED Notes (Signed)
Pt. Reports central / left chest pain with SOB and diaphoresis onset this evening , denies nausea or vomitting , described pain as " squeezing" , history of left bundle branch block her cardiologist is Dr. P. Martinique .

## 2014-11-27 ENCOUNTER — Other Ambulatory Visit: Payer: Self-pay | Admitting: Physician Assistant

## 2014-11-27 DIAGNOSIS — R0789 Other chest pain: Secondary | ICD-10-CM

## 2014-11-27 DIAGNOSIS — R079 Chest pain, unspecified: Secondary | ICD-10-CM | POA: Diagnosis not present

## 2014-11-27 NOTE — ED Notes (Signed)
Dr. Kelly in to assess pt at this time. 

## 2014-11-27 NOTE — ED Provider Notes (Signed)
CSN: [id number]     Arrival date & time 11/26/14  2114 History   First MD Initiated Contact with Patient 11/26/14 2253     Chief Complaint  Patient presents with  . Chest Pain     (Consider location/radiation/quality/duration/timing/severity/associated sxs/prior Treatment) HPI Comments: Patient complaining of intermittent chest discomfort characterized as a squeezing sensation that began today. No associated abdominal discomfort. No association with eating. No leg pain or swelling. Pain comes and goes and nothing makes it better or worse. No associated diaphoresis or dyspnea. No sicca. Near syncope. Had chronic catheterization 15 years ago which was negative. Symptoms persistent and nothing makes it better or worse. Notes from used prior to arrival.  Patient is a 64 y.o. female presenting with chest pain. The history is provided by the patient.  Chest Pain   Past Medical History  Diagnosis Date  . Uterine prolapse   . Fibroid   . Rectocele   . Endometriosis   . Diabetes mellitus   . Hypertension   . Elevated cholesterol   . Left bundle branch block (LBBB)    Past Surgical History  Procedure Laterality Date  . Vaginal hysterectomy      Vag Hyst Post repair  . Pelvic laparoscopy    . Bladder suspension      Sling   Family History  Problem Relation Age of Onset  . Diabetes Father   . Heart disease Father   . Diabetes Sister   . Hypertension Sister   . Uterine cancer Sister   . Cancer Sister 82    uterine  . Colon cancer Maternal Aunt   . Cancer Maternal Aunt     colon  . Gastric cancer Maternal Aunt   . Hypertension Maternal Grandmother   . Hypertension Maternal Grandfather   . Hypertension Paternal Grandmother   . Hypertension Paternal Grandfather    Social History  Substance Use Topics  . Smoking status: Never Smoker   . Smokeless tobacco: Never Used  . Alcohol Use: 0.0 oz/week    0 Standard drinks or equivalent per week     Comment: occ   OB History    Gravida Para Term Preterm AB TAB SAB Ectopic Multiple Living   2 2 2       2      Review of Systems  Cardiovascular: Positive for chest pain.  All other systems reviewed and are negative.     Allergies  Biaxin  Home Medications   Prior to Admission medications   Medication Sig Start Date End Date Taking? Authorizing Provider  aspirin 81 MG tablet Take 81 mg by mouth daily.   Yes Historical Provider, MD  cholecalciferol (VITAMIN D) 1000 UNITS tablet Take 2 Units by mouth daily.   Yes Historical Provider, MD  estradiol (CLIMARA - DOSED IN MG/24 HR) 0.05 mg/24hr patch Place 1 patch (0.05 mg total) onto the skin once a week. 07/20/14  Yes Terrance Mass, MD  hydrochlorothiazide (MICROZIDE) 12.5 MG capsule Take 12.5 mg by mouth daily.   Yes Historical Provider, MD  metFORMIN (GLUCOPHAGE) 1000 MG tablet Take 1,000 mg by mouth 2 (two) times daily with a meal.   Yes Historical Provider, MD  Multiple Vitamin (MULTIVITAMIN) tablet Take 1 tablet by mouth daily.   Yes Historical Provider, MD  pravastatin (PRAVACHOL) 80 MG tablet Take 80 mg by mouth daily.   Yes Historical Provider, MD  quinapril (ACCUPRIL) 40 MG tablet Take 40 mg by mouth at bedtime.   Yes Historical Provider,  MD   BP 162/81 mmHg  Pulse 88  Temp(Src) 98.2 F (36.8 C) (Oral)  Resp 19  SpO2 99% Physical Exam  Constitutional: She is oriented to person, place, and time. She appears well-developed and well-nourished.  Non-toxic appearance. No distress.  HENT:  Head: Normocephalic and atraumatic.  Eyes: Conjunctivae, EOM and lids are normal. Pupils are equal, round, and reactive to light.  Neck: Normal range of motion. Neck supple. No tracheal deviation present. No thyroid mass present.  Cardiovascular: Normal rate, regular rhythm and normal heart sounds.  Exam reveals no gallop.   No murmur heard. Pulmonary/Chest: Effort normal and breath sounds normal. No stridor. No respiratory distress. She has no decreased breath sounds.  She has no wheezes. She has no rhonchi. She has no rales.  Abdominal: Soft. Normal appearance and bowel sounds are normal. She exhibits no distension. There is no tenderness. There is no rebound and no CVA tenderness.  Musculoskeletal: Normal range of motion. She exhibits no edema or tenderness.  Neurological: She is alert and oriented to person, place, and time. She has normal strength. No cranial nerve deficit or sensory deficit. GCS eye subscore is 4. GCS verbal subscore is 5. GCS motor subscore is 6.  Skin: Skin is warm and dry. No abrasion and no rash noted.  Psychiatric: She has a normal mood and affect. Her speech is normal and behavior is normal.  Nursing note and vitals reviewed.   ED Course  Procedures (including critical care time) Labs Review Labs Reviewed  BASIC METABOLIC PANEL - Abnormal; Notable for the following:    Glucose, Bld 136 (*)    All other components within normal limits  CBC  I-STAT TROPOININ, ED    Imaging Review Dg Chest 2 View  11/26/2014  CLINICAL DATA:  Chest pain and shortness of breath for 5 hours EXAM: CHEST - 2 VIEW COMPARISON:  None. FINDINGS: The heart size and mediastinal contours are within normal limits. Both lungs are clear. The visualized skeletal structures are unremarkable. IMPRESSION: No active disease. Electronically Signed   By: Inez Catalina M.D.   On: 11/26/2014 21:47   I have personally reviewed and evaluated these images and lab results as part of my medical decision-making.   EKG Interpretation   Date/Time:  Thursday November 26 2014 21:20:30 EST Ventricular Rate:  99 PR Interval:  146 QRS Duration: 128 QT Interval:  364 QTC Calculation: 467 R Axis:   38 Text Interpretation:  Normal sinus rhythm Left bundle branch block  Abnormal ECG No significant change since last tracing Confirmed by Saleh Ulbrich   MD, Willis Holquin ([id number]) on 11/26/2014 10:54:42 PM      MDM   Final diagnoses:  None   patient seen by cardiology and agrees that  this does not likely represent a cardiac etiology. And patient will follow-up with her cardiologist as an outpatient.    Angela Leigh, MD 11/27/14 [phone]

## 2014-11-27 NOTE — Discharge Instructions (Signed)
Follow-up with your cardiologist as directed by the cardiologist that you saw here in the ER  Nonspecific Chest Pain  Chest pain can be caused by many different conditions. There is always a chance that your pain could be related to something serious, such as a heart attack or a blood clot in your lungs. Chest pain can also be caused by conditions that are not life-threatening. If you have chest pain, it is very important to follow up with your health care provider. CAUSES  Chest pain can be caused by:  Heartburn.  Pneumonia or bronchitis.  Anxiety or stress.  Inflammation around your heart (pericarditis) or lung (pleuritis or pleurisy).  A blood clot in your lung.  A collapsed lung (pneumothorax). It can develop suddenly on its own (spontaneous pneumothorax) or from trauma to the chest.  Shingles infection (varicella-zoster virus).  Heart attack.  Damage to the bones, muscles, and cartilage that make up your chest wall. This can include:  Bruised bones due to injury.  Strained muscles or cartilage due to frequent or repeated coughing or overwork.  Fracture to one or more ribs.  Sore cartilage due to inflammation (costochondritis). RISK FACTORS  Risk factors for chest pain may include:  Activities that increase your risk for trauma or injury to your chest.  Respiratory infections or conditions that cause frequent coughing.  Medical conditions or overeating that can cause heartburn.  Heart disease or family history of heart disease.  Conditions or health behaviors that increase your risk of developing a blood clot.  Having had chicken pox (varicella zoster). SIGNS AND SYMPTOMS Chest pain can feel like:  Burning or tingling on the surface of your chest or deep in your chest.  Crushing, pressure, aching, or squeezing pain.  Dull or sharp pain that is worse when you move, cough, or take a deep breath.  Pain that is also felt in your back, neck, shoulder, or arm, or  pain that spreads to any of these areas. Your chest pain may come and go, or it may stay constant. DIAGNOSIS Lab tests or other studies may be needed to find the cause of your pain. Your health care provider may have you take a test called an ambulatory ECG (electrocardiogram). An ECG records your heartbeat patterns at the time the test is performed. You may also have other tests, such as:  Transthoracic echocardiogram (TTE). During echocardiography, sound waves are used to create a picture of all of the heart structures and to look at how blood flows through your heart.  Transesophageal echocardiogram (TEE).This is a more advanced imaging test that obtains images from inside your body. It allows your health care provider to see your heart in finer detail.  Cardiac monitoring. This allows your health care provider to monitor your heart rate and rhythm in real time.  Holter monitor. This is a portable device that records your heartbeat and can help to diagnose abnormal heartbeats. It allows your health care provider to track your heart activity for several days, if needed.  Stress tests. These can be done through exercise or by taking medicine that makes your heart beat more quickly.  Blood tests.  Imaging tests. TREATMENT  Your treatment depends on what is causing your chest pain. Treatment may include:  Medicines. These may include:  Acid blockers for heartburn.  Anti-inflammatory medicine.  Pain medicine for inflammatory conditions.  Antibiotic medicine, if an infection is present.  Medicines to dissolve blood clots.  Medicines to treat coronary artery disease.  Supportive care for conditions that do not require medicines. This may include:  Resting.  Applying heat or cold packs to injured areas.  Limiting activities until pain decreases. HOME CARE INSTRUCTIONS  If you were prescribed an antibiotic medicine, finish it all even if you start to feel better.  Avoid any  activities that bring on chest pain.  Do not use any tobacco products, including cigarettes, chewing tobacco, or electronic cigarettes. If you need help quitting, ask your health care provider.  Do not drink alcohol.  Take medicines only as directed by your health care provider.  Keep all follow-up visits as directed by your health care provider. This is important. This includes any further testing if your chest pain does not go away.  If heartburn is the cause for your chest pain, you may be told to keep your head raised (elevated) while sleeping. This reduces the chance that acid will go from your stomach into your esophagus.  Make lifestyle changes as directed by your health care provider. These may include:  Getting regular exercise. Ask your health care provider to suggest some activities that are safe for you.  Eating a heart-healthy diet. A registered dietitian can help you to learn healthy eating options.  Maintaining a healthy weight.  Managing diabetes, if necessary.  Reducing stress. SEEK MEDICAL CARE IF:  Your chest pain does not go away after treatment.  You have a rash with blisters on your chest.  You have a fever. SEEK IMMEDIATE MEDICAL CARE IF:   Your chest pain is worse.  You have an increasing cough, or you cough up blood.  You have severe abdominal pain.  You have severe weakness.  You faint.  You have chills.  You have sudden, unexplained chest discomfort.  You have sudden, unexplained discomfort in your arms, back, neck, or jaw.  You have shortness of breath at any time.  You suddenly start to sweat, or your skin gets clammy.  You feel nauseous or you vomit.  You suddenly feel light-headed or dizzy.  Your heart begins to beat quickly, or it feels like it is skipping beats. These symptoms may represent a serious problem that is an emergency. Do not wait to see if the symptoms will go away. Get medical help right away. Call your local  emergency services (911 in the [place] Do not drive yourself to the hospital.   This information is not intended to replace advice given to you by your health care provider. Make sure you discuss any questions you have with your health care provider.   Document Released: 10/05/2004 Document Revised: 01/16/2014 Document Reviewed: 08/01/2013 Elsevier Interactive Patient Education [employer].

## 2014-11-27 NOTE — Consult Note (Signed)
Reason for Consult: chest discomfort Primary Cardiologist: Dr. Martinique Referring Physician: Dr. Olena Heckle Angela Choi is an 64 y.o. female.  HPI: Angela Choi is a 64 yo woman, never tobacco user, T2DM on metformin last hba1c 7.5, LBBB with negative LHC 2001 who presents with chest discomfort that began at 4:30 pm this evening. Her discomfort was initially substernal left-sided but also was more central and some right-sided. She characterized the discomfort at squeezing/throbbing but only lasting for a few seconds at a time. There was no radiation. There was no association with exertion, no diaphoresis, no lightheadedness. No aggravating or alleviating factors. No association with food. She's been under significant stress dealing with family and the estate of her parents over the last few days. Her troponin was negative in the ER and her ECG was stable with LBBB (known for many years). She has stable exercise tolerance and works out several times a week and ballroom dances 4x a week. She is currently symptom free and accompanied by her husband.   Past Medical History  Diagnosis Date  . Uterine prolapse   . Fibroid   . Rectocele   . Endometriosis   . Diabetes mellitus   . Hypertension   . Elevated cholesterol   . Left bundle branch block (LBBB)     Past Surgical History  Procedure Laterality Date  . Vaginal hysterectomy      Vag Hyst Post repair  . Pelvic laparoscopy    . Bladder suspension      Sling    Family History  Problem Relation Age of Onset  . Diabetes Father   . Heart disease Father   . Diabetes Sister   . Hypertension Sister   . Uterine cancer Sister   . Cancer Sister 38    uterine  . Colon cancer Maternal Aunt   . Cancer Maternal Aunt     colon  . Gastric cancer Maternal Aunt   . Hypertension Maternal Grandmother   . Hypertension Maternal Grandfather   . Hypertension Paternal Grandmother   . Hypertension Paternal Grandfather     Social History:  reports that  she has never smoked. She has never used smokeless tobacco. She reports that she drinks alcohol. She reports that she does not use illicit drugs.  Allergies:  Allergies  Allergen Reactions  . Biaxin [Clarithromycin] Hives and Swelling    Medications: I have reviewed the patient's current medications. Prior to Admission:  (Not in a hospital admission) Scheduled:  Results for orders placed or performed during the hospital encounter of 11/26/14 (from the past 48 hour(s))  Basic metabolic panel     Status: Abnormal   Collection Time: 11/26/14  9:32 PM  Result Value Ref Range   Sodium 140 135 - 145 mmol/L   Potassium 4.6 3.5 - 5.1 mmol/L   Chloride 104 101 - 111 mmol/L   CO2 27 22 - 32 mmol/L   Glucose, Bld 136 (H) 65 - 99 mg/dL   BUN 12 6 - 20 mg/dL   Creatinine, Ser 0.68 0.44 - 1.00 mg/dL   Calcium 9.7 8.9 - 10.3 mg/dL   GFR calc non Af Amer >60 >60 mL/min   GFR calc Af Amer >60 >60 mL/min    Comment: (NOTE) The eGFR has been calculated using the CKD EPI equation. This calculation has not been validated in all clinical situations. eGFR's persistently <60 mL/min signify possible Chronic Kidney Disease.    Anion gap 9 5 - 15  CBC  Status: None   Collection Time: 11/26/14  9:32 PM  Result Value Ref Range   WBC 8.3 4.0 - 10.5 K/uL   RBC 4.55 3.87 - 5.11 MIL/uL   Hemoglobin 12.9 12.0 - 15.0 g/dL   HCT 39.2 36.0 - 46.0 %   MCV 86.2 78.0 - 100.0 fL   MCH 28.4 26.0 - 34.0 pg   MCHC 32.9 30.0 - 36.0 g/dL   RDW 13.5 11.5 - 15.5 %   Platelets 342 150 - 400 K/uL  I-stat troponin, ED (not at [facility], [facility])     Status: None   Collection Time: 11/26/14  9:41 PM  Result Value Ref Range   Troponin i, poc 0.00 0.00 - 0.08 ng/mL   Comment 3            Comment: Due to the release kinetics of cTnI, a negative result within the first hours of the onset of symptoms does not rule out myocardial infarction with certainty. If myocardial infarction is still suspected, repeat the test at  appropriate intervals.     Dg Chest 2 View  11/26/2014  CLINICAL DATA:  Chest pain and shortness of breath for 5 hours EXAM: CHEST - 2 VIEW COMPARISON:  None. FINDINGS: The heart size and mediastinal contours are within normal limits. Both lungs are clear. The visualized skeletal structures are unremarkable. IMPRESSION: No active disease. Electronically Signed   By: Inez Catalina M.D.   On: 11/26/2014 21:47    Review of Systems  Constitutional: Negative for fever, chills, weight loss and malaise/fatigue.  HENT: Negative for ear discharge and ear pain.   Eyes: Negative for double vision, photophobia and pain.  Respiratory: Negative for cough, hemoptysis, sputum production and shortness of breath.   Cardiovascular: Positive for chest pain. Negative for palpitations, orthopnea, claudication and leg swelling.  Gastrointestinal: Negative for nausea, vomiting, abdominal pain and diarrhea.  Skin: Negative for rash.  Neurological: Negative for dizziness, tingling, tremors, seizures, loss of consciousness and headaches.  Endo/Heme/Allergies: Negative for polydipsia. Does not bruise/bleed easily.  Psychiatric/Behavioral: Negative for depression, suicidal ideas and substance abuse.   Blood pressure 157/82, pulse 87, temperature 98.2 F (36.8 C), temperature source Oral, resp. rate 22, SpO2 99 %. Physical Exam  Nursing note reviewed. Constitutional: She is oriented to person, place, and time. She appears well-developed and well-nourished. No distress.  HENT:  Head: Normocephalic and atraumatic.  Nose: Nose normal.  Mouth/Throat: Oropharynx is clear and moist. No oropharyngeal exudate.  Eyes: Conjunctivae and EOM are normal. Pupils are equal, round, and reactive to light. No scleral icterus.  Neck: Normal range of motion. Neck supple. No JVD present. No tracheal deviation present.  Cardiovascular: Normal rate, regular rhythm, normal heart sounds and intact distal pulses.  Exam reveals no gallop.     No murmur heard. Respiratory: Effort normal and breath sounds normal. No respiratory distress. She has no wheezes. She has no rales.  GI: Soft. Bowel sounds are normal. She exhibits no distension. There is no tenderness. There is no rebound.  Musculoskeletal: Normal range of motion. She exhibits no edema.  Neurological: She is alert and oriented to person, place, and time. No cranial nerve deficit. Coordination normal.  Skin: Skin is warm and dry. No rash noted. She is not diaphoretic. No erythema.  Psychiatric: She has a normal mood and affect. Her behavior is normal. Judgment and thought content normal.  labs reviewed; cr 0.68, bun 12, trop 0.00, K 4.6 EKG SR, LBBB Chest x-ray: no acute process  Assessment/Plan: Angela Choi is a 64 yo woman, never tobacco user, T2DM on metformin last hba1c 7.5, LBBB with negative LHC 2001 who presents with chest discomfort that began at 4:30 pm this evening. Differential diagnosis is musculoskeletal pain, esophageal spasm, GERD, pericarditis, ACS/NSTEMI among other etiologies. I favor a diagnosis of atypical chest pain given duration of seconds and no association with exertion and no exertional limitations. She prefers to go home, she has a negative troponin and stable ECG. We discussed arranging follow-up and outpatient stress test. She will come back to the ER/call 911 for any recurrent symptoms.   - outpatient stress test and follow-up in the next 1-2 weeks unless symptoms recur - continue to stay active   Etna Green, [place] 11/27/2014, 1:00 AM

## 2014-12-08 ENCOUNTER — Ambulatory Visit: Payer: BLUE CROSS/BLUE SHIELD | Admitting: Physician Assistant

## 2014-12-08 ENCOUNTER — Telehealth (HOSPITAL_COMMUNITY): Payer: Self-pay | Admitting: *Deleted

## 2014-12-08 NOTE — Telephone Encounter (Signed)
Patient given detailed instructions per Myocardial Perfusion Study Information Sheet for the test on 12/10/14 at 0945. Patient notified to arrive 15 minutes early and that it is imperative to arrive on time for appointment to keep from having the test rescheduled.  If you need to cancel or reschedule your appointment, please call the office within 24 hours of your appointment. Failure to do so may result in a cancellation of your appointment, and a $50 no show fee. Patient verbalized understanding.Jaxston Chohan, [place]

## 2014-12-10 ENCOUNTER — Ambulatory Visit (HOSPITAL_COMMUNITY): Payer: BLUE CROSS/BLUE SHIELD | Attending: Internal Medicine

## 2014-12-10 DIAGNOSIS — R0789 Other chest pain: Secondary | ICD-10-CM | POA: Diagnosis present

## 2014-12-10 DIAGNOSIS — I447 Left bundle-branch block, unspecified: Secondary | ICD-10-CM | POA: Diagnosis not present

## 2014-12-10 DIAGNOSIS — I10 Essential (primary) hypertension: Secondary | ICD-10-CM | POA: Insufficient documentation

## 2014-12-10 DIAGNOSIS — R0609 Other forms of dyspnea: Secondary | ICD-10-CM | POA: Diagnosis not present

## 2014-12-10 DIAGNOSIS — E119 Type 2 diabetes mellitus without complications: Secondary | ICD-10-CM | POA: Diagnosis not present

## 2014-12-10 LAB — MYOCARDIAL PERFUSION IMAGING
LV dias vol: 89 mL
LV sys vol: 32 mL
Peak HR: 115 {beats}/min
RATE: 0.26
Rest HR: 85 {beats}/min
SDS: 2
SRS: 3
SSS: 5
TID: 0.99

## 2014-12-10 MED ORDER — REGADENOSON 0.4 MG/5ML IV SOLN
0.4000 mg | Freq: Once | INTRAVENOUS | Status: AC
  Administered 2014-12-10: 0.4 mg via INTRAVENOUS

## 2014-12-10 MED ORDER — TECHNETIUM TC 99M SESTAMIBI GENERIC - CARDIOLITE
32.4000 | Freq: Once | INTRAVENOUS | Status: AC | PRN
  Administered 2014-12-10: 32 via INTRAVENOUS

## 2014-12-10 MED ORDER — TECHNETIUM TC 99M SESTAMIBI GENERIC - CARDIOLITE
10.5000 | Freq: Once | INTRAVENOUS | Status: AC | PRN
  Administered 2014-12-10: 11 via INTRAVENOUS

## 2014-12-14 ENCOUNTER — Encounter: Payer: Self-pay | Admitting: Cardiology

## 2014-12-14 ENCOUNTER — Ambulatory Visit (INDEPENDENT_AMBULATORY_CARE_PROVIDER_SITE_OTHER): Payer: BLUE CROSS/BLUE SHIELD | Admitting: Cardiology

## 2014-12-14 VITALS — BP 130/80 | HR 88 | Ht 66.0 in | Wt 186.0 lb

## 2014-12-14 DIAGNOSIS — I447 Left bundle-branch block, unspecified: Secondary | ICD-10-CM | POA: Diagnosis not present

## 2014-12-14 DIAGNOSIS — IMO0001 Reserved for inherently not codable concepts without codable children: Secondary | ICD-10-CM

## 2014-12-14 DIAGNOSIS — Z0389 Encounter for observation for other suspected diseases and conditions ruled out: Secondary | ICD-10-CM

## 2014-12-14 DIAGNOSIS — E7800 Pure hypercholesterolemia, unspecified: Secondary | ICD-10-CM | POA: Diagnosis not present

## 2014-12-14 DIAGNOSIS — I10 Essential (primary) hypertension: Secondary | ICD-10-CM | POA: Diagnosis not present

## 2014-12-14 DIAGNOSIS — R0789 Other chest pain: Secondary | ICD-10-CM | POA: Diagnosis not present

## 2014-12-14 MED ORDER — NITROGLYCERIN 0.4 MG SL SUBL: 0.4000 mg | SUBLINGUAL_TABLET | SUBLINGUAL | Status: DC | PRN

## 2014-12-14 NOTE — Patient Instructions (Addendum)
Kerin Ransom, [place], has recommended making the following medication changes: You have been given a prescription for Nitroglycerin tablets. This has been sent to the pharmacy electronically.  Your physician recommends that you schedule a follow-up appointment in 6 months with Dr Martinique. You will receive a reminder letter in the mail two months in advance. If you don't receive a letter, please call our office to schedule the follow-up appointment.  If you need a refill on your cardiac medications before your next appointment, please call your pharmacy.

## 2014-12-14 NOTE — Assessment & Plan Note (Signed)
Diabetic 15 yrs, on Glucophage

## 2014-12-14 NOTE — Assessment & Plan Note (Signed)
On statin Rx 

## 2014-12-14 NOTE — Progress Notes (Signed)
12/14/2014 Clovis Fredrickson   08/15/1950  [id number]  Primary Physician Tawanna Solo, MD Primary Cardiologist: Dr Martinique  HPI:  Pleasant 64 y/o female, seen in the past by Dr Martinique and had a negative cath in 2002. She has had NIDDM x 15 yrs. She denies any neuropathy or retinopathy. She has had a chronic LBBB. She was seen in the ED 11/27/14 with complaints of chest "tightness" with radiation to her Lt arm. She ruled out for an MI and the cardiology fellow felt her symptoms were atypical. She was discharged from the ED and had a [place] 12/10/14 which was low risk. She is in the office today for follow up.           She told me she has continued to have some intermittent chest "tightness". It's not always associated with activity. She is active, goes to the gym 3 x week and square dances 4  X week. She says she has limited her square dancing for fear of having chest discomfort. She admits to some vague dyspnea as well. She also told me she has been under a great deal of stress recently.    Current Outpatient Prescriptions  Medication Sig Dispense Refill  . aspirin 81 MG tablet Take 81 mg by mouth daily.    . cholecalciferol (VITAMIN D) 1000 UNITS tablet Take 2 Units by mouth daily.    Marland Kitchen estradiol (CLIMARA - DOSED IN MG/24 HR) 0.05 mg/24hr patch Place 1 patch (0.05 mg total) onto the skin once a week. 12 patch 4  . hydrochlorothiazide (MICROZIDE) 12.5 MG capsule Take 12.5 mg by mouth daily.    . metFORMIN (GLUCOPHAGE) 1000 MG tablet Take 1,000 mg by mouth 2 (two) times daily with a meal.    . Multiple Vitamin (MULTIVITAMIN) tablet Take 1 tablet by mouth daily.    . pravastatin (PRAVACHOL) 80 MG tablet Take 80 mg by mouth daily.    . quinapril (ACCUPRIL) 40 MG tablet Take 40 mg by mouth at bedtime.    . nitroGLYCERIN (NITROSTAT) 0.4 MG SL tablet Place 1 tablet (0.4 mg total) under the tongue every 5 (five) minutes as needed (severe chest tightness). 90 tablet 3   No current  facility-administered medications for this visit.    Allergies  Allergen Reactions  . Biaxin [Clarithromycin] Hives and Swelling    Social History   Social History  . Marital Status: Married    Spouse Name: N/A  . Number of Children: N/A  . Years of Education: N/A   Occupational History  . Not on file.   Social History Main Topics  . Smoking status: Never Smoker   . Smokeless tobacco: Never Used  . Alcohol Use: 0.0 oz/week    0 Standard drinks or equivalent per week     Comment: occ  . Drug Use: No  . Sexual Activity: Yes    Birth Control/ Protection: Surgical   Other Topics Concern  . Not on file   Social History Narrative     Review of Systems: General: negative for chills, fever, night sweats or weight changes.  Cardiovascular: negative for chest pain, dyspnea on exertion, edema, orthopnea, palpitations, paroxysmal nocturnal dyspnea or shortness of breath Dermatological: negative for rash Respiratory: negative for cough or wheezing Urologic: negative for hematuria Abdominal: negative for nausea, vomiting, diarrhea, bright red blood per rectum, melena, or hematemesis Neurologic: negative for visual changes, syncope, or dizziness All other systems reviewed and are otherwise negative except as noted above.  Blood pressure 130/80, pulse 88, height 5\' 6"  (1.676 m), weight 186 lb (84.369 kg).  General appearance: alert, cooperative and no distress Neck: no carotid bruit and no JVD Lungs: clear to auscultation bilaterally Heart: regular rate and rhythm Extremities: extremities normal, atraumatic, no cyanosis or edema Skin: Skin color, texture, turgor normal. No rashes or lesions Neurologic: Grossly normal  EKG NSR LBBB  ASSESSMENT AND PLAN:   Type 2 diabetes mellitus, controlled ([place]) Diabetic 15 yrs, on Glucophage  Hypertension controlled  LBBB (left bundle branch block) Chronic  Elevated cholesterol On statin Rx  Normal coronary arteries  2002 .   PLAN  Pt was seen by Dr Martinique and myself. Dr Martinique suggest no further Rx or evaluation a t his time. If the pt has increasing symptoms she will let us know. I did give her an Rx for Sl NTG prn with instructions on when to use it. F/U with Dr Martinique in 6 months, if she is doing well we could see her PRN after that.   Kerin Ransom K PA-C 12/14/2014 12:03 PM

## 2014-12-14 NOTE — Assessment & Plan Note (Signed)
controlled 

## 2014-12-14 NOTE — Assessment & Plan Note (Signed)
Chronic. 

## 2015-07-06 ENCOUNTER — Other Ambulatory Visit: Payer: Self-pay | Admitting: Gynecology

## 2015-07-06 DIAGNOSIS — Z1231 Encounter for screening mammogram for malignant neoplasm of breast: Secondary | ICD-10-CM

## 2015-07-19 ENCOUNTER — Ambulatory Visit
Admission: RE | Admit: 2015-07-19 | Discharge: 2015-07-19 | Disposition: A | Discharge status: Home / Self Care | Payer: BLUE CROSS/BLUE SHIELD | Source: Ambulatory Visit | Attending: Gynecology | Admitting: Gynecology

## 2015-07-19 DIAGNOSIS — Z1231 Encounter for screening mammogram for malignant neoplasm of breast: Secondary | ICD-10-CM

## 2015-07-22 ENCOUNTER — Encounter: Payer: Self-pay | Admitting: Gynecology

## 2015-07-22 ENCOUNTER — Ambulatory Visit (INDEPENDENT_AMBULATORY_CARE_PROVIDER_SITE_OTHER): Payer: BLUE CROSS/BLUE SHIELD | Admitting: Gynecology

## 2015-07-22 VITALS — BP 138/80 | Ht 65.0 in | Wt 175.4 lb

## 2015-07-22 DIAGNOSIS — Z01419 Encounter for gynecological examination (general) (routine) without abnormal findings: Secondary | ICD-10-CM | POA: Diagnosis not present

## 2015-07-22 DIAGNOSIS — Z79890 Hormone replacement therapy (postmenopausal): Secondary | ICD-10-CM

## 2015-07-22 MED ORDER — ESTRADIOL 0.05 MG/24HR TD PTWK: 0.0500 mg | MEDICATED_PATCH | TRANSDERMAL | Status: DC

## 2015-07-22 NOTE — Progress Notes (Signed)
Angela Choi [birth year] [id number]   History:    65 y.o.  for annual gyn exam with no complaints today. For her vasomotor symptoms which are now under control she is on the Climara 0.1 mg weekly patch which she alternates and applies it every other week.Patient with past history of transvaginal hysterectomy with posterior repair in 1995 for uterine prolapse and fibroid uterus. In 2011 she had a suburethral sling placed as well. She denied any urinary incontinence today. The patient had cervical dysplasia in 1982 and subsequent Pap smears have been normal since then. She also has a history of benign colon polyps in the past and her last colonoscopy was in 2015 and was normal. Her last bone density study was in 2016 which was normal. Patient's taking her calcium and vitamin D and exercises regularly. She has lost 5 pounds from last year. Her PCP is Dr. Kathyrn Lass who is been doing her blood work and all her vaccines are up-to-date.  Past medical history,surgical history, family history and social history were all reviewed and documented in the EPIC chart.  Gynecologic History No LMP recorded. Patient has had a hysterectomy. Contraception: status post hysterectomy Last Pap: 2012. Results were: normal Last mammogram: 2017. Results were: normal  Obstetric History OB History  Gravida Para Term Preterm AB SAB TAB Ectopic Multiple Living  2 2 2       2     # Outcome Date GA Lbr Len/2nd Weight Sex Delivery Anes PTL Lv  2 Term           1 Term                ROS: A ROS was performed and pertinent positives and negatives are included in the history.  GENERAL: No fevers or chills. HEENT: No change in vision, no earache, sore throat or sinus congestion. NECK: No pain or stiffness. CARDIOVASCULAR: No chest pain or pressure. No palpitations. PULMONARY: No shortness of breath, cough or wheeze. GASTROINTESTINAL: No abdominal pain, nausea, vomiting or diarrhea, melena or bright red blood per rectum.  GENITOURINARY: No urinary frequency, urgency, hesitancy or dysuria. MUSCULOSKELETAL: No joint or muscle pain, no back pain, no recent trauma. DERMATOLOGIC: No rash, no itching, no lesions. ENDOCRINE: No polyuria, polydipsia, no heat or cold intolerance. No recent change in weight. HEMATOLOGICAL: No anemia or easy bruising or bleeding. NEUROLOGIC: No headache, seizures, numbness, tingling or weakness. PSYCHIATRIC: No depression, no loss of interest in normal activity or change in sleep pattern.     Exam: chaperone present  BP 138/80 mmHg  Ht 5\' 5"  (1.651 m)  Wt 175 lb 6.4 oz (79.561 kg)  BMI 29.19 kg/m2  Body mass index is 29.19 kg/(m^2).  General appearance : Well developed well nourished female. No acute distress HEENT: Eyes: no retinal hemorrhage or exudates,  Neck supple, trachea midline, no carotid bruits, no thyroidmegaly Lungs: Clear to auscultation, no rhonchi or wheezes, or rib retractions  Heart: Regular rate and rhythm, no murmurs or gallops Breast:Examined in sitting and supine position were symmetrical in appearance, no palpable masses or tenderness,  no skin retraction, no nipple inversion, no nipple discharge, no skin discoloration, no axillary or supraclavicular lymphadenopathy Abdomen: no palpable masses or tenderness, no rebound or guarding Extremities: no edema or skin discoloration or tenderness  Pelvic:  Bartholin, Urethra, Skene Glands: Within normal limits             Vagina: No gross lesions or discharge  Cervix: Absent  Uterus  absent  Adnexa  Without masses or tenderness  Anus and perineum  normal   Rectovaginal  normal sphincter tone without palpated masses or tenderness             Hemoccult cards provided     Assessment/Plan:  65 y.o. female for annual exam doing well on Climara once a week transdermal patch 0.1 mg which she applies every other week. Pap smear no longer indicated according to the new guidelines. We discussed importance of calcium vitamin D  and weightbearing exercises for osteoporosis prevention. She will need her next bone density study next year. She was provided with fecal Hemoccult cards to submit to the office for testing. We also discussed importance of monthly breast exam. Her PCP has been her blood work.   Terrance Mass MD, 2:23 PM 07/22/2015

## 2015-07-26 ENCOUNTER — Telehealth: Payer: Self-pay | Admitting: *Deleted

## 2015-07-26 MED ORDER — ESTRADIOL 0.05 MG/24HR TD PTWK: 0.0500 mg | MEDICATED_PATCH | TRANSDERMAL | Status: DC

## 2015-07-26 NOTE — Telephone Encounter (Signed)
Pt called stating Climara patch should be sent to mail order not local pharmacy. Rx sent.

## 2016-05-24 ENCOUNTER — Encounter: Payer: Self-pay | Admitting: Gynecology

## 2016-06-15 ENCOUNTER — Other Ambulatory Visit: Payer: Self-pay | Admitting: Gynecology

## 2016-06-15 DIAGNOSIS — Z1231 Encounter for screening mammogram for malignant neoplasm of breast: Secondary | ICD-10-CM

## 2016-07-25 ENCOUNTER — Encounter: Payer: Self-pay | Admitting: Gynecology

## 2016-07-25 ENCOUNTER — Ambulatory Visit
Admission: RE | Admit: 2016-07-25 | Discharge: 2016-07-25 | Disposition: A | Discharge status: Home / Self Care | Payer: BLUE CROSS/BLUE SHIELD | Source: Ambulatory Visit | Attending: Gynecology | Admitting: Gynecology

## 2016-07-25 ENCOUNTER — Ambulatory Visit (INDEPENDENT_AMBULATORY_CARE_PROVIDER_SITE_OTHER): Payer: BLUE CROSS/BLUE SHIELD | Admitting: Gynecology

## 2016-07-25 VITALS — BP 122/80 | Ht 65.5 in | Wt 179.0 lb

## 2016-07-25 DIAGNOSIS — Z1231 Encounter for screening mammogram for malignant neoplasm of breast: Secondary | ICD-10-CM

## 2016-07-25 DIAGNOSIS — Z01419 Encounter for gynecological examination (general) (routine) without abnormal findings: Secondary | ICD-10-CM

## 2016-07-25 DIAGNOSIS — Z780 Asymptomatic menopausal state: Secondary | ICD-10-CM

## 2016-07-25 NOTE — Progress Notes (Signed)
Angela Choi Oct 11, 1950 [id number]   History:    66 y.o.  for annual gyn exam with no complaints today. Patient for vasomotor symptoms had been on Climara 0.1 mg which she was now applying every other week. She states that on the week that she's off the patch she is complaining of no vasomotor symptoms and is ready to discontinue it altogether since she's been on it close to 10 years.Patient with past history of transvaginal hysterectomy with posterior repair in 1995 for uterine prolapse and fibroid uterus. In 2011 she had a suburethral sling placed as well. She denied any urinary incontinence today. The patient had cervical dysplasia in 1982 and subsequent Pap smears have been normal since then. She also has a history of benign colon polyps in the past and her last colonoscopy was in 2015 and was normal. Her last bone density study was in 2016 which was normal. Patient's taking her calcium and vitamin D and exercises regularly. She has lost 5 pounds from last year. Her PCP is Dr. Kathyrn Lass who is been doing her blood work and all her vaccines are up-to-date.  Past medical history,surgical history, family history and social history were all reviewed and documented in the EPIC chart.  Gynecologic History No LMP recorded. Patient has had a hysterectomy. Contraception: status post hysterectomy Last Pap: 2012. Results were: normal Last mammogram: 2018. Results were: Done this Angela results pending  Obstetric History OB History  Gravida Para Term Preterm AB Living  2 2 2     2   SAB TAB Ectopic Multiple Live Births               # Outcome Date GA Lbr Len/2nd Weight Sex Delivery Anes PTL Lv  2 Term           1 Term                ROS: A ROS was performed and pertinent positives and negatives are included in the history.  GENERAL: No fevers or chills. HEENT: No change in vision, no earache, sore throat or sinus congestion. NECK: No pain or stiffness. CARDIOVASCULAR: No chest pain or  pressure. No palpitations. PULMONARY: No shortness of breath, cough or wheeze. GASTROINTESTINAL: No abdominal pain, nausea, vomiting or diarrhea, melena or bright red blood per rectum. GENITOURINARY: No urinary frequency, urgency, hesitancy or dysuria. MUSCULOSKELETAL: No joint or muscle pain, no back pain, no recent trauma. DERMATOLOGIC: No rash, no itching, no lesions. ENDOCRINE: No polyuria, polydipsia, no heat or cold intolerance. No recent change in weight. HEMATOLOGICAL: No anemia or easy bruising or bleeding. NEUROLOGIC: No headache, seizures, numbness, tingling or weakness. PSYCHIATRIC: No depression, no loss of interest in normal activity or change in sleep pattern.     Exam: chaperone present  BP 122/80   Ht 5' 5.5" (1.664 m)   Wt 179 lb (81.2 kg)   BMI 29.33 kg/m   Body mass index is 29.33 kg/m.  General appearance : Well developed well nourished female. No acute distress HEENT: Eyes: no retinal hemorrhage or exudates,  Neck supple, trachea midline, no carotid bruits, no thyroidmegaly Lungs: Clear to auscultation, no rhonchi or wheezes, or rib retractions  Heart: Regular rate and rhythm, no murmurs or gallops Breast:Examined in sitting and supine position were symmetrical in appearance, no palpable masses or tenderness,  no skin retraction, no nipple inversion, no nipple discharge, no skin discoloration, no axillary or supraclavicular lymphadenopathy Abdomen: no palpable masses or tenderness, no rebound or  guarding Extremities: no edema or skin discoloration or tenderness  Pelvic:  Bartholin, Urethra, Skene Glands: Within normal limits             Vagina: No gross lesions or discharge  Cervix: Absent  Uterus absent  Adnexa  Without masses or tenderness  Anus and perineum  normal   Rectovaginal  normal sphincter tone without palpated masses or tenderness             Hemoccult cards provided     Assessment/Plan:  66 y.o. female for annual exam should begin tapering off her  hormone replacement therapy consisting of Climara transdermal patch 0.1 mg which she applies every other week. We discussed importance of calcium and vitamin D supplementation regular exercises for osteoporosis prevention. Patient to schedule her bone density study this coming August. Pap smear not indicated. PCP has been doing her blood work.   Terrance Mass MD, 10:28 AM 07/25/2016

## 2016-07-25 NOTE — Patient Instructions (Signed)
Bone Densitometry Bone densitometry is an imaging test that uses a special X-ray to measure the amount of calcium and other minerals in your bones (bone density). This test is also known as a bone mineral density test or dual-energy X-ray absorptiometry (DXA). The test can measure bone density at your hip and your spine. It is similar to having a regular X-ray. You may have this test to:  Diagnose a condition that causes weak or thin bones (osteoporosis).  Predict your risk of a broken bone (fracture).  Determine how well osteoporosis treatment is working.  Tell a health care provider about:  Any allergies you have.  All medicines you are taking, including vitamins, herbs, eye drops, creams, and over-the-counter medicines.  Any problems you or family members have had with anesthetic medicines.  Any blood disorders you have.  Any surgeries you have had.  Any medical conditions you have.  Possibility of pregnancy.  Any other medical test you had within the previous 14 days that used contrast material. What are the risks? Generally, this is a safe procedure. However, problems can occur and may include the following:  This test exposes you to a very small amount of radiation.  The risks of radiation exposure may be greater to unborn children.  What happens before the procedure?  Do not take any calcium supplements for 24 hours before having the test. You can otherwise eat and drink what you usually do.  Take off all metal jewelry, eyeglasses, dental appliances, and any other metal objects. What happens during the procedure?  You may lie on an exam table. There will be an X-ray generator below you and an imaging device above you.  Other devices, such as boxes or braces, may be used to position your body properly for the scan.  You will need to lie still while the machine slowly scans your body.  The images will show up on a computer monitor. What happens after the  procedure? You may need more testing at a later time. This information is not intended to replace advice given to you by your health care provider. Make sure you discuss any questions you have with your health care provider. Document Released: 01/18/2004 Document Revised: 06/03/2015 Document Reviewed: 06/05/2013 Elsevier Interactive Patient Education  2018 Elsevier Inc.   

## 2016-08-22 ENCOUNTER — Other Ambulatory Visit: Payer: Self-pay | Admitting: Gynecology

## 2016-08-22 DIAGNOSIS — Z13820 Encounter for screening for osteoporosis: Secondary | ICD-10-CM

## 2016-09-05 ENCOUNTER — Ambulatory Visit (INDEPENDENT_AMBULATORY_CARE_PROVIDER_SITE_OTHER): Payer: BLUE CROSS/BLUE SHIELD

## 2016-09-05 ENCOUNTER — Other Ambulatory Visit: Payer: Self-pay | Admitting: Gynecology

## 2016-09-05 ENCOUNTER — Encounter: Payer: Self-pay | Admitting: Gynecology

## 2016-09-05 DIAGNOSIS — Z13820 Encounter for screening for osteoporosis: Secondary | ICD-10-CM

## 2016-09-05 DIAGNOSIS — Z780 Asymptomatic menopausal state: Secondary | ICD-10-CM

## 2016-12-11 ENCOUNTER — Other Ambulatory Visit: Payer: Self-pay

## 2016-12-11 DIAGNOSIS — H52223 Regular astigmatism, bilateral: Secondary | ICD-10-CM | POA: Diagnosis not present

## 2016-12-11 DIAGNOSIS — H5203 Hypermetropia, bilateral: Secondary | ICD-10-CM | POA: Diagnosis not present

## 2016-12-11 DIAGNOSIS — H524 Presbyopia: Secondary | ICD-10-CM | POA: Diagnosis not present

## 2016-12-11 DIAGNOSIS — H2513 Age-related nuclear cataract, bilateral: Secondary | ICD-10-CM | POA: Diagnosis not present

## 2016-12-11 DIAGNOSIS — E119 Type 2 diabetes mellitus without complications: Secondary | ICD-10-CM | POA: Diagnosis not present

## 2016-12-11 NOTE — Patient Outreach (Signed)
[facility]) Care Management  12/11/2016  Angela Choi 07-22-1950 [id number]   [employer] High risk screen:  History of diabetes. Member reports she recently had to be taken off Metformin and changed to Amaryl due to Gastrointestinal issues while on Metformin. She states checks her blood sugar daily and states that she is being followed closely by her primary care and has an upcoming appointment within the next two weeks.   History of HTN, Member reports her blood pressure is managed. She states she is aware of her target range for her blood pressure readings.  Member denies any issues/concerns or needs at this time.  No care management needs identified at this time.  RNCM will send successful outreach letter.  Thea Silversmith, RN, MSN, [place] Coordinator Cell: [phone]

## 2016-12-26 DIAGNOSIS — E119 Type 2 diabetes mellitus without complications: Secondary | ICD-10-CM | POA: Diagnosis not present

## 2016-12-26 DIAGNOSIS — E7800 Pure hypercholesterolemia, unspecified: Secondary | ICD-10-CM | POA: Diagnosis not present

## 2016-12-26 DIAGNOSIS — I10 Essential (primary) hypertension: Secondary | ICD-10-CM | POA: Diagnosis not present

## 2017-02-02 IMAGING — NM NM MISC PROCEDURE
3 series · 18 of 18 positions shown · non-contrast
Comparison: none

[Series 1: stress-gsp_(id)_sa · 6.4mm · 6.40mm/px · 6 of 512 frames shown]
[frame 43/512]
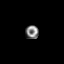
[frame 128/512]
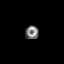
[frame 214/512]
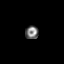
[frame 299/512]
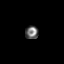
[frame 384/512]
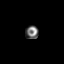
[frame 470/512]
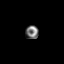

[Series 1: stress-sum-em_(id)_sa · 6.4mm · 6.40mm/px · 6 of 64 frames shown]
[frame 6/64]
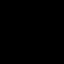
[frame 16/64]
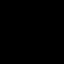
[frame 27/64]
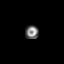
[frame 38/64]
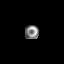
[frame 48/64]
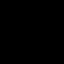
[frame 59/64]
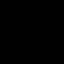

[Series 1: rest_(id)_sa · 6.4mm · 6.40mm/px · 6 of 64 frames shown]
[frame 6/64]
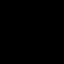
[frame 16/64]
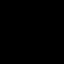
[frame 27/64]
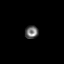
[frame 38/64]
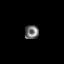
[frame 48/64]
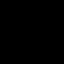
[frame 59/64]
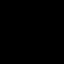

[18 of 18 positions shown; findings below may reference images not displayed]

Canned report from images found in remote index.

Refer to host system for actual result text.

## 2017-06-15 ENCOUNTER — Other Ambulatory Visit: Payer: Self-pay | Admitting: Obstetrics & Gynecology

## 2017-06-15 DIAGNOSIS — Z1231 Encounter for screening mammogram for malignant neoplasm of breast: Secondary | ICD-10-CM

## 2017-07-13 DIAGNOSIS — E119 Type 2 diabetes mellitus without complications: Secondary | ICD-10-CM | POA: Diagnosis not present

## 2017-07-13 DIAGNOSIS — E7800 Pure hypercholesterolemia, unspecified: Secondary | ICD-10-CM | POA: Diagnosis not present

## 2017-07-16 DIAGNOSIS — E7800 Pure hypercholesterolemia, unspecified: Secondary | ICD-10-CM | POA: Diagnosis not present

## 2017-07-16 DIAGNOSIS — Z0000 Encounter for general adult medical examination without abnormal findings: Secondary | ICD-10-CM | POA: Diagnosis not present

## 2017-07-16 DIAGNOSIS — Z23 Encounter for immunization: Secondary | ICD-10-CM | POA: Diagnosis not present

## 2017-07-16 DIAGNOSIS — E1169 Type 2 diabetes mellitus with other specified complication: Secondary | ICD-10-CM | POA: Diagnosis not present

## 2017-07-16 DIAGNOSIS — I10 Essential (primary) hypertension: Secondary | ICD-10-CM | POA: Diagnosis not present

## 2017-07-16 DIAGNOSIS — E663 Overweight: Secondary | ICD-10-CM | POA: Diagnosis not present

## 2017-07-26 ENCOUNTER — Encounter: Payer: BLUE CROSS/BLUE SHIELD | Admitting: Obstetrics & Gynecology

## 2017-07-27 ENCOUNTER — Ambulatory Visit
Admission: RE | Admit: 2017-07-27 | Discharge: 2017-07-27 | Disposition: A | Discharge status: Home / Self Care | Payer: PPO | Source: Ambulatory Visit | Attending: Obstetrics & Gynecology | Admitting: Obstetrics & Gynecology

## 2017-07-27 DIAGNOSIS — Z1231 Encounter for screening mammogram for malignant neoplasm of breast: Secondary | ICD-10-CM | POA: Diagnosis not present

## 2017-07-30 ENCOUNTER — Encounter: Payer: Self-pay | Admitting: Obstetrics & Gynecology

## 2017-07-30 ENCOUNTER — Ambulatory Visit (INDEPENDENT_AMBULATORY_CARE_PROVIDER_SITE_OTHER): Payer: PPO | Admitting: Obstetrics & Gynecology

## 2017-07-30 VITALS — BP 150/84 | Ht 65.0 in | Wt 184.6 lb

## 2017-07-30 DIAGNOSIS — Z90710 Acquired absence of both cervix and uterus: Secondary | ICD-10-CM

## 2017-07-30 DIAGNOSIS — Z01419 Encounter for gynecological examination (general) (routine) without abnormal findings: Secondary | ICD-10-CM | POA: Diagnosis not present

## 2017-07-30 DIAGNOSIS — Z780 Asymptomatic menopausal state: Secondary | ICD-10-CM

## 2017-07-30 NOTE — Patient Instructions (Signed)
1. Well female exam with routine gynecological exam Gynecologic exam status post total hysterectomy and menopause.  Pap test not indicated at this time.  Breast exam normal.  Screening mammogram was negative July 27, 2017.  Health labs with family physician.  Colonoscopy in 2015.  2. H/O total hysterectomy  3. Menopause present Stopped hormone replacement therapy 8 weeks ago.  Minimal vasomotor menopausal symptoms, well-tolerated.  Bone density was normal in August 2018.  Vitamin D supplements, calcium rich nutrition and weightbearing physical activity recommended.  Angela Choi, it was a pleasure meeting you today!

## 2017-07-30 NOTE — Progress Notes (Signed)
Angela Choi March 17, 1950 [id number]   History:    67 y.o. [place] Married.  Retired.  Traveling with husband.  RP:  Established patient presenting for annual gyn exam   HPI:  Stopped HRT x 8 weeks, doing well with mild hot flushes.  S/P vaginal Hysterectomy/Posterior repair.  Sling procedure.  Mild SUI with sneezing/coughing.  No pelvic pain.  Sexually active without pain.  Breasts wnl.  Screening mammo negative 07/27/2017.  BMI 30.72. Health Labs with Fam MD.  Past medical history,surgical history, family history and social history were all reviewed and documented in the EPIC chart.  Gynecologic History No LMP recorded. Patient has had a hysterectomy. Contraception: status post hysterectomy Last Pap: 2012. Results were: Negative Last mammogram: 07/27/2017. Results were: Negative Bone Density: 08/2016 Normal Colonoscopy: 2015  Obstetric History OB History  Gravida Para Term Preterm AB Living  2 2 2     2   SAB TAB Ectopic Multiple Live Births               # Outcome Date GA Lbr Len/2nd Weight Sex Delivery Anes PTL Lv  2 Term           1 Term              ROS: A ROS was performed and pertinent positives and negatives are included in the history.  GENERAL: No fevers or chills. HEENT: No change in vision, no earache, sore throat or sinus congestion. NECK: No pain or stiffness. CARDIOVASCULAR: No chest pain or pressure. No palpitations. PULMONARY: No shortness of breath, cough or wheeze. GASTROINTESTINAL: No abdominal pain, nausea, vomiting or diarrhea, melena or bright red blood per rectum. GENITOURINARY: No urinary frequency, urgency, hesitancy or dysuria. MUSCULOSKELETAL: No joint or muscle pain, no back pain, no recent trauma. DERMATOLOGIC: No rash, no itching, no lesions. ENDOCRINE: No polyuria, polydipsia, no heat or cold intolerance. No recent change in weight. HEMATOLOGICAL: No anemia or easy bruising or bleeding. NEUROLOGIC: No headache, seizures, numbness, tingling or  weakness. PSYCHIATRIC: No depression, no loss of interest in normal activity or change in sleep pattern.     Exam:   BP (!) 150/84   Ht 5\' 5"  (1.651 m)   Wt 184 lb 9.6 oz (83.7 kg)   BMI 30.72 kg/m   Body mass index is 30.72 kg/m.  General appearance : Well developed well nourished female. No acute distress HEENT: Eyes: no retinal hemorrhage or exudates,  Neck supple, trachea midline, no carotid bruits, no thyroidmegaly Lungs: Clear to auscultation, no rhonchi or wheezes, or rib retractions  Heart: Regular rate and rhythm, no murmurs or gallops Breast:Examined in sitting and supine position were symmetrical in appearance, no palpable masses or tenderness,  no skin retraction, no nipple inversion, no nipple discharge, no skin discoloration, no axillary or supraclavicular lymphadenopathy Abdomen: no palpable masses or tenderness, no rebound or guarding Extremities: no edema or skin discoloration or tenderness  Pelvic: Vulva: Normal             Vagina: No gross lesions or discharge  Cervix/Uterus absent  Adnexa  Without masses or tenderness  Anus: Normal   Assessment/Plan:  67 y.o. female for annual exam   1. Well female exam with routine gynecological exam Gynecologic exam status post total hysterectomy and menopause.  Pap test not indicated at this time.  Breast exam normal.  Screening mammogram was negative July 27, 2017.  Health labs with family physician.  Colonoscopy in 2015.  2. H/O total hysterectomy  3. Menopause present Stopped hormone replacement therapy 8 weeks ago.  Minimal vasomotor menopausal symptoms, well-tolerated.  Bone density was normal in August 2018.  Vitamin D supplements, calcium rich nutrition and weightbearing physical activity recommended.  Princess Bruins MD, 2:47 PM 07/30/2017

## 2017-08-29 DIAGNOSIS — Z6830 Body mass index (BMI) 30.0-30.9, adult: Secondary | ICD-10-CM | POA: Diagnosis not present

## 2017-08-29 DIAGNOSIS — M4727 Other spondylosis with radiculopathy, lumbosacral region: Secondary | ICD-10-CM | POA: Diagnosis not present

## 2017-08-29 DIAGNOSIS — M7062 Trochanteric bursitis, left hip: Secondary | ICD-10-CM | POA: Diagnosis not present

## 2017-09-03 DIAGNOSIS — D1801 Hemangioma of skin and subcutaneous tissue: Secondary | ICD-10-CM | POA: Diagnosis not present

## 2017-09-03 DIAGNOSIS — L814 Other melanin hyperpigmentation: Secondary | ICD-10-CM | POA: Diagnosis not present

## 2017-09-03 DIAGNOSIS — D2262 Melanocytic nevi of left upper limb, including shoulder: Secondary | ICD-10-CM | POA: Diagnosis not present

## 2017-09-03 DIAGNOSIS — L821 Other seborrheic keratosis: Secondary | ICD-10-CM | POA: Diagnosis not present

## 2017-09-03 DIAGNOSIS — L918 Other hypertrophic disorders of the skin: Secondary | ICD-10-CM | POA: Diagnosis not present

## 2017-09-03 DIAGNOSIS — D225 Melanocytic nevi of trunk: Secondary | ICD-10-CM | POA: Diagnosis not present

## 2017-10-17 DIAGNOSIS — Z23 Encounter for immunization: Secondary | ICD-10-CM | POA: Diagnosis not present

## 2017-10-18 ENCOUNTER — Telehealth: Payer: Self-pay | Admitting: *Deleted

## 2017-10-18 NOTE — Telephone Encounter (Signed)
Patient informed, transferred to appointment desk  

## 2017-10-18 NOTE — Telephone Encounter (Signed)
Please schedule a visit to discuss management.

## 2017-10-18 NOTE — Telephone Encounter (Signed)
Patient has been off HRT about 6 months now was using estradiol 0.5 mg patch, has been doing well, but has noticed increase in hot flashes and night sweats,issues with sleep, increased weight gain. Patient said you told her call if symptoms increased, patient would like to start back on HRT. Please advise

## 2017-10-29 ENCOUNTER — Ambulatory Visit: Payer: PPO | Admitting: Obstetrics & Gynecology

## 2017-10-29 ENCOUNTER — Encounter: Payer: Self-pay | Admitting: Obstetrics & Gynecology

## 2017-10-29 VITALS — BP 130/82

## 2017-10-29 DIAGNOSIS — N951 Menopausal and female climacteric states: Secondary | ICD-10-CM

## 2017-10-29 NOTE — Progress Notes (Signed)
    Angela Choi February 04, 1950 [id number]        67 y.o.  G2P2L2 Married  RP: Hot flushes and night sweats off HRT  HPI: S/P Total Hysterectomy.  Stopped HRT in 05/2017.  Progressive recurrence of hot flushes and night sweats.  Having difficulty sleeping currently.  Has mood swings.  Weight gain.  No pelvic pain.  Sexually active, no pain with IC.   OB History  Gravida Para Term Preterm AB Living  2 2 2     2   SAB TAB Ectopic Multiple Live Births               # Outcome Date GA Lbr Len/2nd Weight Sex Delivery Anes PTL Lv  2 Term           1 Term             Past medical history,surgical history, problem list, medications, allergies, family history and social history were all reviewed and documented in the EPIC chart.   Directed ROS with pertinent positives and negatives documented in the history of present illness/assessment and plan.  Exam:  Vitals:   10/29/17 1140  BP: 130/82   General appearance:  Normal  Gynecologic exam: Deferred   Assessment/Plan:  67 y.o. G2P2002   1. Menopausal syndrome Risks of systemic HRT with Estradiol at age 67 and after >10 yrs of use discussed with patient.  In particular, informed of the increased risks of Breast Cancer and strokes.  Given that the risks outweigh the benefits at this point, recommended not to restart on HRT.  Progesterone only treatment, Anti-Depressant treatment and PhytoEstrogen treatment discussed.  Decision to try PhytoEstrogens first.  Will call back if not sufficient.  Counseling on above issues and coordination of care more than 50% for 25 minutes.  Princess Bruins MD, 12:17 PM 10/29/2017

## 2017-10-29 NOTE — Patient Instructions (Signed)
1. Menopausal syndrome Risks of systemic HRT with Estradiol at age 68 and after >10 yrs of use discussed with patient.  In particular, informed of the increased risks of Breast Cancer and strokes.  Given that the risks outweigh the benefits at this point, recommended not to restart on HRT.  Progesterone only treatment, Anti-Depressant treatment and PhytoEstrogen treatment discussed.  Decision to try PhytoEstrogens first.  Will call back if not sufficient.  Angela Choi, it was a pleasure seeing you today!

## 2017-10-31 DIAGNOSIS — Z23 Encounter for immunization: Secondary | ICD-10-CM | POA: Diagnosis not present

## 2017-10-31 DIAGNOSIS — E663 Overweight: Secondary | ICD-10-CM | POA: Diagnosis not present

## 2017-10-31 DIAGNOSIS — E7800 Pure hypercholesterolemia, unspecified: Secondary | ICD-10-CM | POA: Diagnosis not present

## 2017-10-31 DIAGNOSIS — E1169 Type 2 diabetes mellitus with other specified complication: Secondary | ICD-10-CM | POA: Diagnosis not present

## 2017-10-31 DIAGNOSIS — Z0000 Encounter for general adult medical examination without abnormal findings: Secondary | ICD-10-CM | POA: Diagnosis not present

## 2017-11-06 DIAGNOSIS — E7800 Pure hypercholesterolemia, unspecified: Secondary | ICD-10-CM | POA: Diagnosis not present

## 2017-11-06 DIAGNOSIS — E669 Obesity, unspecified: Secondary | ICD-10-CM | POA: Diagnosis not present

## 2017-11-06 DIAGNOSIS — N951 Menopausal and female climacteric states: Secondary | ICD-10-CM | POA: Diagnosis not present

## 2017-11-06 DIAGNOSIS — E1169 Type 2 diabetes mellitus with other specified complication: Secondary | ICD-10-CM | POA: Diagnosis not present

## 2017-11-06 DIAGNOSIS — I10 Essential (primary) hypertension: Secondary | ICD-10-CM | POA: Diagnosis not present

## 2017-11-29 DIAGNOSIS — E119 Type 2 diabetes mellitus without complications: Secondary | ICD-10-CM | POA: Diagnosis not present

## 2017-11-29 DIAGNOSIS — H5203 Hypermetropia, bilateral: Secondary | ICD-10-CM | POA: Diagnosis not present

## 2017-11-29 DIAGNOSIS — Z7984 Long term (current) use of oral hypoglycemic drugs: Secondary | ICD-10-CM | POA: Diagnosis not present

## 2017-11-29 DIAGNOSIS — H52223 Regular astigmatism, bilateral: Secondary | ICD-10-CM | POA: Diagnosis not present

## 2017-12-18 DIAGNOSIS — E669 Obesity, unspecified: Secondary | ICD-10-CM | POA: Diagnosis not present

## 2017-12-18 DIAGNOSIS — E7800 Pure hypercholesterolemia, unspecified: Secondary | ICD-10-CM | POA: Diagnosis not present

## 2017-12-18 DIAGNOSIS — I10 Essential (primary) hypertension: Secondary | ICD-10-CM | POA: Diagnosis not present

## 2017-12-18 DIAGNOSIS — E1169 Type 2 diabetes mellitus with other specified complication: Secondary | ICD-10-CM | POA: Diagnosis not present

## 2017-12-18 DIAGNOSIS — Z6830 Body mass index (BMI) 30.0-30.9, adult: Secondary | ICD-10-CM | POA: Diagnosis not present

## 2018-02-15 DIAGNOSIS — E7800 Pure hypercholesterolemia, unspecified: Secondary | ICD-10-CM | POA: Diagnosis not present

## 2018-02-15 DIAGNOSIS — E669 Obesity, unspecified: Secondary | ICD-10-CM | POA: Diagnosis not present

## 2018-02-15 DIAGNOSIS — Z6830 Body mass index (BMI) 30.0-30.9, adult: Secondary | ICD-10-CM | POA: Diagnosis not present

## 2018-02-15 DIAGNOSIS — E1169 Type 2 diabetes mellitus with other specified complication: Secondary | ICD-10-CM | POA: Diagnosis not present

## 2018-02-15 DIAGNOSIS — I10 Essential (primary) hypertension: Secondary | ICD-10-CM | POA: Diagnosis not present

## 2018-02-19 DIAGNOSIS — Z7984 Long term (current) use of oral hypoglycemic drugs: Secondary | ICD-10-CM | POA: Diagnosis not present

## 2018-02-19 DIAGNOSIS — G5601 Carpal tunnel syndrome, right upper limb: Secondary | ICD-10-CM | POA: Diagnosis not present

## 2018-02-19 DIAGNOSIS — E669 Obesity, unspecified: Secondary | ICD-10-CM | POA: Diagnosis not present

## 2018-02-19 DIAGNOSIS — E1169 Type 2 diabetes mellitus with other specified complication: Secondary | ICD-10-CM | POA: Diagnosis not present

## 2018-02-19 DIAGNOSIS — I10 Essential (primary) hypertension: Secondary | ICD-10-CM | POA: Diagnosis not present

## 2018-02-19 DIAGNOSIS — E7800 Pure hypercholesterolemia, unspecified: Secondary | ICD-10-CM | POA: Diagnosis not present

## 2018-02-19 DIAGNOSIS — Z6829 Body mass index (BMI) 29.0-29.9, adult: Secondary | ICD-10-CM | POA: Diagnosis not present

## 2018-02-19 DIAGNOSIS — N951 Menopausal and female climacteric states: Secondary | ICD-10-CM | POA: Diagnosis not present

## 2018-02-19 DIAGNOSIS — E663 Overweight: Secondary | ICD-10-CM | POA: Diagnosis not present

## 2018-06-27 DIAGNOSIS — M4727 Other spondylosis with radiculopathy, lumbosacral region: Secondary | ICD-10-CM | POA: Diagnosis not present

## 2018-06-27 DIAGNOSIS — I10 Essential (primary) hypertension: Secondary | ICD-10-CM | POA: Diagnosis not present

## 2018-06-27 DIAGNOSIS — E1169 Type 2 diabetes mellitus with other specified complication: Secondary | ICD-10-CM | POA: Diagnosis not present

## 2018-06-27 DIAGNOSIS — Z7984 Long term (current) use of oral hypoglycemic drugs: Secondary | ICD-10-CM | POA: Diagnosis not present

## 2018-06-27 DIAGNOSIS — E7800 Pure hypercholesterolemia, unspecified: Secondary | ICD-10-CM | POA: Diagnosis not present

## 2018-08-01 ENCOUNTER — Encounter: Payer: PPO | Admitting: Obstetrics & Gynecology

## 2018-09-03 ENCOUNTER — Other Ambulatory Visit: Payer: Self-pay | Admitting: Obstetrics & Gynecology

## 2018-09-03 DIAGNOSIS — Z1231 Encounter for screening mammogram for malignant neoplasm of breast: Secondary | ICD-10-CM

## 2018-09-06 ENCOUNTER — Other Ambulatory Visit: Payer: Self-pay

## 2018-09-06 DIAGNOSIS — L814 Other melanin hyperpigmentation: Secondary | ICD-10-CM | POA: Diagnosis not present

## 2018-09-06 DIAGNOSIS — D2262 Melanocytic nevi of left upper limb, including shoulder: Secondary | ICD-10-CM | POA: Diagnosis not present

## 2018-09-06 DIAGNOSIS — L821 Other seborrheic keratosis: Secondary | ICD-10-CM | POA: Diagnosis not present

## 2018-09-06 DIAGNOSIS — D485 Neoplasm of uncertain behavior of skin: Secondary | ICD-10-CM | POA: Diagnosis not present

## 2018-09-06 DIAGNOSIS — D2261 Melanocytic nevi of right upper limb, including shoulder: Secondary | ICD-10-CM | POA: Diagnosis not present

## 2018-09-06 DIAGNOSIS — D225 Melanocytic nevi of trunk: Secondary | ICD-10-CM | POA: Diagnosis not present

## 2018-09-09 ENCOUNTER — Encounter: Payer: Self-pay | Admitting: Obstetrics & Gynecology

## 2018-09-09 ENCOUNTER — Ambulatory Visit (INDEPENDENT_AMBULATORY_CARE_PROVIDER_SITE_OTHER): Payer: PPO | Admitting: Obstetrics & Gynecology

## 2018-09-09 ENCOUNTER — Other Ambulatory Visit: Payer: Self-pay

## 2018-09-09 VITALS — BP 160/90 | Ht 65.0 in | Wt 182.0 lb

## 2018-09-09 DIAGNOSIS — Z01419 Encounter for gynecological examination (general) (routine) without abnormal findings: Secondary | ICD-10-CM | POA: Diagnosis not present

## 2018-09-09 DIAGNOSIS — Z1272 Encounter for screening for malignant neoplasm of vagina: Secondary | ICD-10-CM

## 2018-09-09 DIAGNOSIS — E6609 Other obesity due to excess calories: Secondary | ICD-10-CM

## 2018-09-09 DIAGNOSIS — Z6830 Body mass index (BMI) 30.0-30.9, adult: Secondary | ICD-10-CM

## 2018-09-09 DIAGNOSIS — Z780 Asymptomatic menopausal state: Secondary | ICD-10-CM

## 2018-09-09 NOTE — Progress Notes (Signed)
Angela Choi May 13, 1950 [id number]   History:    68 y.o. G2P2L2 Married  RP:  Established patient presenting for annual gyn exam   HPI: S/P Total Hysterectomy.  Post menopause on no HRT x 05/2017.  Started on Fluoxetine by Fam MD to control mood swings and hot flushes.  No pelvic pain.  No pain with IC.  Urine/BMs normal.  Doing Kegels.  Breasts normal.  BMI 30.29.  Walking.  Health labs with [facility] 2015, 5 yr schedule.  Past medical history,surgical history, family history and social history were all reviewed and documented in the EPIC chart.  Gynecologic History No LMP recorded. Patient has had a hysterectomy. Contraception: status post hysterectomy Last Pap: 2012. Results were: Negative Last mammogram: 07/2017. Results were: Negative.  Scheduled 10/2018. Bone Density: 08/2016 normal, repeat at 5 yrs. Colonoscopy: 2015, will schedule now with Gastro.  Obstetric History OB History  Gravida Para Term Preterm AB Living  2 2 2     2   SAB TAB Ectopic Multiple Live Births               # Outcome Date GA Lbr Len/2nd Weight Sex Delivery Anes PTL Lv  2 Term           1 Term              ROS: A ROS was performed and pertinent positives and negatives are included in the history.  GENERAL: No fevers or chills. HEENT: No change in vision, no earache, sore throat or sinus congestion. NECK: No pain or stiffness. CARDIOVASCULAR: No chest pain or pressure. No palpitations. PULMONARY: No shortness of breath, cough or wheeze. GASTROINTESTINAL: No abdominal pain, nausea, vomiting or diarrhea, melena or bright red blood per rectum. GENITOURINARY: No urinary frequency, urgency, hesitancy or dysuria. MUSCULOSKELETAL: No joint or muscle pain, no back pain, no recent trauma. DERMATOLOGIC: No rash, no itching, no lesions. ENDOCRINE: No polyuria, polydipsia, no heat or cold intolerance. No recent change in weight. HEMATOLOGICAL: No anemia or easy bruising or bleeding. NEUROLOGIC: No headache,  seizures, numbness, tingling or weakness. PSYCHIATRIC: No depression, no loss of interest in normal activity or change in sleep pattern.     Exam:   BP (!) 160/90   Ht 5\' 5"  (1.651 m)   Wt 182 lb (82.6 kg)   BMI 30.29 kg/m   Body mass index is 30.29 kg/m.  General appearance : Well developed well nourished female. No acute distress HEENT: Eyes: no retinal hemorrhage or exudates,  Neck supple, trachea midline, no carotid bruits, no thyroidmegaly Lungs: Clear to auscultation, no rhonchi or wheezes, or rib retractions  Heart: Regular rate and rhythm, no murmurs or gallops Breast:Examined in sitting and supine position were symmetrical in appearance, no palpable masses or tenderness,  no skin retraction, no nipple inversion, no nipple discharge, no skin discoloration, no axillary or supraclavicular lymphadenopathy Abdomen: no palpable masses or tenderness, no rebound or guarding Extremities: no edema or skin discoloration or tenderness  Pelvic: Vulva: Normal             Vagina: No gross lesions or discharge.  Pap reflex done.  Cervix/Uterus absent  Adnexa  Without masses or tenderness  Anus: Normal   Assessment/Plan:  68 y.o. female for annual exam   1. Encounter for Papanicolaou smear of vagina as part of routine gynecological examination Gynecologic exam status post total hysterectomy and menopause.  Pap reflex done on the vaginal vault.  Breast exam normal.  Next screening mammogram scheduled for October 2020.  Colonoscopy to schedule now with gastro.  Health labs with family physician.  2. Postmenopause Started on Fluoxetine for mood swings and hot flushes.  Vitamin D supplements, calcium intake of 1200 mg daily and regular weightbearing physical activity is recommended.  Last bone density August 2018 was normal, will repeat at 5 years.  3. Class 1 obesity due to excess calories without serious comorbidity with body mass index (BMI) of 30.0 to 30.9 in adult Recommend a lower  calorie/carb diet such as [employer].  Aerobic physical activities 5 times a week and weightlifting every 2 days. Other orders - FLUoxetine (PROZAC) 20 MG capsule; Take 20 mg by mouth daily.  Princess Bruins MD, 3:30 PM 09/09/2018

## 2018-09-09 NOTE — Addendum Note (Signed)
Addended by: Thurnell Garbe A on: 09/09/2018 03:55 PM   Modules accepted: Orders

## 2018-09-09 NOTE — Patient Instructions (Signed)
1. Encounter for Papanicolaou smear of vagina as part of routine gynecological examination Gynecologic exam status post total hysterectomy and menopause.  Pap reflex done on the vaginal vault.  Breast exam normal.  Next screening mammogram scheduled for October 2020.  Colonoscopy to schedule now with gastro.  Health labs with family physician.  2. Postmenopause Started on Fluoxetine for mood swings and hot flushes.  Vitamin D supplements, calcium intake of 1200 mg daily and regular weightbearing physical activity is recommended.  Last bone density August 2018 was normal, will repeat at 5 years.  3. Class 1 obesity due to excess calories without serious comorbidity with body mass index (BMI) of 30.0 to 30.9 in adult Recommend a lower calorie/carb diet such as [employer].  Aerobic physical activities 5 times a week and weightlifting every 2 days. Other orders - FLUoxetine (PROZAC) 20 MG capsule; Take 20 mg by mouth daily.  Jametta, it was a pleasure seeing you today!  I will inform you of your results as soon as they are available.

## 2018-09-10 LAB — PAP IG W/ RFLX HPV ASCU

## 2018-09-26 DIAGNOSIS — E119 Type 2 diabetes mellitus without complications: Secondary | ICD-10-CM | POA: Diagnosis not present

## 2018-09-26 DIAGNOSIS — I10 Essential (primary) hypertension: Secondary | ICD-10-CM | POA: Diagnosis not present

## 2018-09-26 DIAGNOSIS — E7800 Pure hypercholesterolemia, unspecified: Secondary | ICD-10-CM | POA: Diagnosis not present

## 2018-09-26 DIAGNOSIS — Z7984 Long term (current) use of oral hypoglycemic drugs: Secondary | ICD-10-CM | POA: Diagnosis not present

## 2018-09-26 DIAGNOSIS — E1169 Type 2 diabetes mellitus with other specified complication: Secondary | ICD-10-CM | POA: Diagnosis not present

## 2018-09-26 DIAGNOSIS — M4727 Other spondylosis with radiculopathy, lumbosacral region: Secondary | ICD-10-CM | POA: Diagnosis not present

## 2018-09-30 DIAGNOSIS — Z86010 Personal history of colonic polyps: Secondary | ICD-10-CM | POA: Diagnosis not present

## 2018-09-30 DIAGNOSIS — Z0000 Encounter for general adult medical examination without abnormal findings: Secondary | ICD-10-CM | POA: Diagnosis not present

## 2018-09-30 DIAGNOSIS — N951 Menopausal and female climacteric states: Secondary | ICD-10-CM | POA: Diagnosis not present

## 2018-10-04 DIAGNOSIS — Z7984 Long term (current) use of oral hypoglycemic drugs: Secondary | ICD-10-CM | POA: Diagnosis not present

## 2018-10-04 DIAGNOSIS — E1169 Type 2 diabetes mellitus with other specified complication: Secondary | ICD-10-CM | POA: Diagnosis not present

## 2018-10-04 DIAGNOSIS — E7800 Pure hypercholesterolemia, unspecified: Secondary | ICD-10-CM | POA: Diagnosis not present

## 2018-10-04 DIAGNOSIS — M5441 Lumbago with sciatica, right side: Secondary | ICD-10-CM | POA: Diagnosis not present

## 2018-10-04 DIAGNOSIS — Z6830 Body mass index (BMI) 30.0-30.9, adult: Secondary | ICD-10-CM | POA: Diagnosis not present

## 2018-10-04 DIAGNOSIS — Z23 Encounter for immunization: Secondary | ICD-10-CM | POA: Diagnosis not present

## 2018-10-04 DIAGNOSIS — I10 Essential (primary) hypertension: Secondary | ICD-10-CM | POA: Diagnosis not present

## 2018-10-15 ENCOUNTER — Other Ambulatory Visit: Payer: Self-pay

## 2018-10-15 ENCOUNTER — Ambulatory Visit
Admission: RE | Admit: 2018-10-15 | Discharge: 2018-10-15 | Disposition: A | Discharge status: Home / Self Care | Payer: PPO | Source: Ambulatory Visit | Attending: Obstetrics & Gynecology | Admitting: Obstetrics & Gynecology

## 2018-10-15 DIAGNOSIS — Z1231 Encounter for screening mammogram for malignant neoplasm of breast: Secondary | ICD-10-CM | POA: Diagnosis not present

## 2018-10-16 ENCOUNTER — Encounter: Payer: Self-pay | Admitting: Gynecology

## 2018-12-25 DIAGNOSIS — I10 Essential (primary) hypertension: Secondary | ICD-10-CM | POA: Diagnosis not present

## 2018-12-25 DIAGNOSIS — E7800 Pure hypercholesterolemia, unspecified: Secondary | ICD-10-CM | POA: Diagnosis not present

## 2018-12-25 DIAGNOSIS — E1169 Type 2 diabetes mellitus with other specified complication: Secondary | ICD-10-CM | POA: Diagnosis not present

## 2018-12-25 DIAGNOSIS — E119 Type 2 diabetes mellitus without complications: Secondary | ICD-10-CM | POA: Diagnosis not present

## 2018-12-25 DIAGNOSIS — M4727 Other spondylosis with radiculopathy, lumbosacral region: Secondary | ICD-10-CM | POA: Diagnosis not present

## 2019-01-08 DIAGNOSIS — H52223 Regular astigmatism, bilateral: Secondary | ICD-10-CM | POA: Diagnosis not present

## 2019-01-08 DIAGNOSIS — H524 Presbyopia: Secondary | ICD-10-CM | POA: Diagnosis not present

## 2019-01-08 DIAGNOSIS — H5203 Hypermetropia, bilateral: Secondary | ICD-10-CM | POA: Diagnosis not present

## 2019-01-08 DIAGNOSIS — E119 Type 2 diabetes mellitus without complications: Secondary | ICD-10-CM | POA: Diagnosis not present

## 2019-01-15 DIAGNOSIS — Z86010 Personal history of colonic polyps: Secondary | ICD-10-CM | POA: Diagnosis not present

## 2019-01-17 DIAGNOSIS — Z7984 Long term (current) use of oral hypoglycemic drugs: Secondary | ICD-10-CM | POA: Diagnosis not present

## 2019-01-17 DIAGNOSIS — E7800 Pure hypercholesterolemia, unspecified: Secondary | ICD-10-CM | POA: Diagnosis not present

## 2019-01-17 DIAGNOSIS — E119 Type 2 diabetes mellitus without complications: Secondary | ICD-10-CM | POA: Diagnosis not present

## 2019-01-30 ENCOUNTER — Ambulatory Visit: Payer: HMO | Attending: Internal Medicine

## 2019-01-30 DIAGNOSIS — Z1159 Encounter for screening for other viral diseases: Secondary | ICD-10-CM | POA: Diagnosis not present

## 2019-01-30 DIAGNOSIS — Z23 Encounter for immunization: Secondary | ICD-10-CM

## 2019-01-30 NOTE — Progress Notes (Signed)
   Covid-19 Vaccination Clinic  Name:  Angela Choi    MRN: [id number] DOB: 01-Oct-1950  01/30/2019  [name] was observed post Covid-19 immunization for 30 minutes based on pre-vaccination screening without incidence. She was provided with Vaccine Information Sheet and instruction to access the V-Safe system.   [name] was instructed to call 911 with any severe reactions post vaccine: Marland Kitchen Difficulty breathing  . Swelling of your face and throat  . A fast heartbeat  . A bad rash all over your body  . Dizziness and weakness    Immunizations Administered    Name Date Dose VIS Date Route   Pfizer COVID-19 Vaccine 01/30/2019  7:17 PM 0.3 mL 12/20/2018 Intramuscular   Manufacturer: [place]   Lot: [id number]   [place]: [id number]

## 2019-02-04 DIAGNOSIS — K5730 Diverticulosis of large intestine without perforation or abscess without bleeding: Secondary | ICD-10-CM | POA: Diagnosis not present

## 2019-02-04 DIAGNOSIS — K621 Rectal polyp: Secondary | ICD-10-CM | POA: Diagnosis not present

## 2019-02-04 DIAGNOSIS — Z86010 Personal history of colonic polyps: Secondary | ICD-10-CM | POA: Diagnosis not present

## 2019-02-07 DIAGNOSIS — K621 Rectal polyp: Secondary | ICD-10-CM | POA: Diagnosis not present

## 2019-02-20 ENCOUNTER — Ambulatory Visit: Payer: HMO | Attending: Internal Medicine

## 2019-02-20 DIAGNOSIS — Z23 Encounter for immunization: Secondary | ICD-10-CM

## 2019-02-20 NOTE — Progress Notes (Signed)
   Covid-19 Vaccination Clinic  Name:  Angela Choi    MRN: [id number] DOB: Jul 19, 1950  02/20/2019  [name] was observed post Covid-19 immunization for 15 minutes without incidence. She was provided with Vaccine Information Sheet and instruction to access the V-Safe system.   [name] was instructed to call 911 with any severe reactions post vaccine: Marland Kitchen Difficulty breathing  . Swelling of your face and throat  . A fast heartbeat  . A bad rash all over your body  . Dizziness and weakness    Immunizations Administered    Name Date Dose VIS Date Route   Pfizer COVID-19 Vaccine 02/20/2019 10:09 AM 0.3 mL 12/20/2018 Intramuscular   Manufacturer: [place]   Lot: [id number]   [place]: [id number]

## 2019-03-03 DIAGNOSIS — I10 Essential (primary) hypertension: Secondary | ICD-10-CM | POA: Diagnosis not present

## 2019-03-03 DIAGNOSIS — M4727 Other spondylosis with radiculopathy, lumbosacral region: Secondary | ICD-10-CM | POA: Diagnosis not present

## 2019-03-03 DIAGNOSIS — Z7984 Long term (current) use of oral hypoglycemic drugs: Secondary | ICD-10-CM | POA: Diagnosis not present

## 2019-03-03 DIAGNOSIS — E1169 Type 2 diabetes mellitus with other specified complication: Secondary | ICD-10-CM | POA: Diagnosis not present

## 2019-03-03 DIAGNOSIS — E7800 Pure hypercholesterolemia, unspecified: Secondary | ICD-10-CM | POA: Diagnosis not present

## 2019-03-03 DIAGNOSIS — E119 Type 2 diabetes mellitus without complications: Secondary | ICD-10-CM | POA: Diagnosis not present

## 2019-03-07 ENCOUNTER — Other Ambulatory Visit: Payer: Self-pay

## 2019-03-07 NOTE — Patient Outreach (Signed)
  [facility]) Care Management Chronic Special Needs Program    03/07/2019  Name: Angela Choi, DOB: 21-Aug-1950  MRN: [id number]   Ms. Angela Choi is enrolled in a chronic special needs plan for Diabetes. Telephone call to client for health risk assessment completion / review. Unable to reach. HIPAA compliant voice message left with call back phone number and return call request.   PLAN; RNCM will attempt 2nd  telephone call to client within 2 weeks.    Quinn Plowman RN,BSN,CCM [facility] [phone]

## 2019-03-11 ENCOUNTER — Ambulatory Visit: Payer: Self-pay

## 2019-03-12 ENCOUNTER — Ambulatory Visit: Payer: HMO

## 2019-03-12 ENCOUNTER — Other Ambulatory Visit: Payer: Self-pay

## 2019-03-12 NOTE — Patient Outreach (Addendum)
[facility]) Care Management Chronic Special Needs Program  03/12/2019  Name: Angela Choi DOB: 28-May-1950  MRN: [id number]  Angela Choi is enrolled in a chronic special needs plan for Diabetes. Chronic Care Management Coordinator telephoned client to review health risk assessment and to develop individualized care plan. HIPAA verified.   Introduced the chronic care management program, importance of client participation, and taking their care plan to all provider appointments and inpatient facilities.    Subjective: Client reports she is doing well. Client states she has been a Type 2 diabetic for approximately 20 years. She states she was taking Januvia but her doctor has recently switched her to [place]. RNCM advised client to check her blood pressure at least 2 times a week due to Invokana's ability to lower blood pressure. Client verbalized understanding.  Client reports she does not check her blood sugar daily but every other day. Client reports her glucometer is over 6 years old. RNCM advised client to contact her HTA concierge to request coverage options for new glucometer.  Client reports yesterday's fasting blood sugar was 126. Client reports her blood sugar will drop to 68 every 2 weeks. She states she has not discussed this with her doctor. RNCM advised client to discuss this with her doctor. Client reports she has low blood sugar symptoms when her blood sugar is 68. She states she will drink juice and eat a peanut butter cracker or just peanut butter.  RNCM advised client to check her feet daily, keep feet moisturized and do not apply lotion in between toes. Client verbalized understanding.  Client states she would like to lose around 9 lbs.  RNCM informed client she would mail her diabetic diet education article and encouraged client to continue her physical activity.   Goals Addressed            This Visit's Progress   . Client understands the importance of  follow-up with providers by attending scheduled visits       Client reports adherence to provider appointments.  Review of medical records indicate most recent primary care provider visit 10/04/18,  follow up with eye doctor 01/08/19, follow up with gastroenterologist, 01/15/19, and follow up with GYN doctor, 09/09/18.  RN case manager discussed importance of ongoing follow up visits with providers    . Client will verbalize knowledge of self management of Hypertension as evidences by BP reading of 140/90 or less; or as defined by provider       Assessed high blood pressure self management skills.   Client reports having blood pressure monitor.  RN case manager advised client to check her blood pressure at least 2 times per week and take results to your doctor appointment.   Counseled client that Invokana may lower blood pressure due to diuretic effect.  Continue to take medications as prescribed.  Ask your provider, " what is my target blood pressure." Follow up with your provider as recommended.  RN case manager will mail client education article: High blood pressure> What you can do.      . Diabetes Patient stated goal, I would like to lose 9 lbs" (pt-stated)       RN case manager encouraged client to continue physical activity.  RN case manager will mail client education article: Diabetes and diet, Diabetes diet.    Marland Kitchen HEMOGLOBIN A1C < 7.0       Per medical record review client's last Hgb A1c was 6.8 completed on 01/17/19 Discussed diabetes self  management actions:  Glucose monitoring per provider recommendation  Visit provider every 3-6 months as directed  Hbg A1C level every 3-6 months.  Carbohydrate controlled meal planning  Taking diabetes medication as prescribed by provider  Physical activity   RN case manager will mail client education article on : Diabetic diet, Diabetes and diet    . Maintain timely refills of diabetic medication as prescribed within the year .        Client reports good medication taking behavior Review of medical record refill report indicates client maintains timely refills of diabetic medication Continue to take your medications as prescribed.  Follow up with your doctor if you have questions. Contact your primary provider pharmacist if you are unable to afford your medications Contact your assigned RN case manager if you have difficulty obtaining your medications     . Obtain annual  Lipid Profile, LDL-C       Lipid profile was completed on 01/17/19 The goal for LDL ( " Bad cholesterol) is 70 mg/ dl as you are at high risk for complications try to avoid saturated fats, trans-fats, and eat more fiber.     . Obtain Annual Eye (retinal)  Exam        Eye exam completed on 01/08/19 Discussed importance of having a diabetic eye exam yearly. Plan to schedule your eye exam yearly.   Report any new eye symptoms to your eye doctor.     . Obtain Annual Foot Exam       Client states her provider checks her feet at least 2 times per year. Per client last foot exam was 10/04/18 Continue to check your feet daily and report any concerns to your doctor.      . Obtain annual screen for micro albuminuria (urine) , nephropathy (kidney problems)       Micro albuminuria completed 10/04/18 Attend yearly physical and follow up visits with your provider as recommended.     . Obtain Hemoglobin A1C at least 2 times per year       Hgb A1c completed 01/17/19.  Discussed with client importance of consistent follow up with provider for exams and lab work.     . Visit Primary Care Provider or Endocrinologist at least 2 times per year        Last follow up appointment with provider per client was telehealth visit in December 2020.   Last in office visit with primary care provider per client was 10/04/18.  Call and schedule a yearly physical and follow up visits as recommended by your provider.       ASSESSMENT;  Assessment: Client is meeting diabetes  self-management goal of hemoglobin A1C of <7.0% with most recent reading of 6.8% on 01/17/19 with reports of hypoglycemia . Client has good understanding of:  COVID-19 cause, symptoms, precautions (social distancing, stay at home order, hand washing, wear face covering when unable to maintain or ensure 6 foot social distancing), and symptoms requiring provider notification.  Plan:  Send successful outreach letter with a copy of their individualized care plan, Send individual care plan to provider and Send educational material  Chronic care management coordination will outreach in:  12 months    Quinn Plowman RN,BSN,CCM [facility] [phone]

## 2019-03-26 DIAGNOSIS — M4727 Other spondylosis with radiculopathy, lumbosacral region: Secondary | ICD-10-CM | POA: Diagnosis not present

## 2019-03-26 DIAGNOSIS — E1169 Type 2 diabetes mellitus with other specified complication: Secondary | ICD-10-CM | POA: Diagnosis not present

## 2019-03-26 DIAGNOSIS — E7800 Pure hypercholesterolemia, unspecified: Secondary | ICD-10-CM | POA: Diagnosis not present

## 2019-03-26 DIAGNOSIS — E119 Type 2 diabetes mellitus without complications: Secondary | ICD-10-CM | POA: Diagnosis not present

## 2019-03-26 DIAGNOSIS — I10 Essential (primary) hypertension: Secondary | ICD-10-CM | POA: Diagnosis not present

## 2019-05-23 DIAGNOSIS — M5441 Lumbago with sciatica, right side: Secondary | ICD-10-CM | POA: Diagnosis not present

## 2019-05-23 DIAGNOSIS — E119 Type 2 diabetes mellitus without complications: Secondary | ICD-10-CM | POA: Diagnosis not present

## 2019-05-23 DIAGNOSIS — Z1159 Encounter for screening for other viral diseases: Secondary | ICD-10-CM | POA: Diagnosis not present

## 2019-05-27 DIAGNOSIS — E663 Overweight: Secondary | ICD-10-CM | POA: Diagnosis not present

## 2019-05-27 DIAGNOSIS — I10 Essential (primary) hypertension: Secondary | ICD-10-CM | POA: Diagnosis not present

## 2019-05-27 DIAGNOSIS — E7800 Pure hypercholesterolemia, unspecified: Secondary | ICD-10-CM | POA: Diagnosis not present

## 2019-05-27 DIAGNOSIS — E1169 Type 2 diabetes mellitus with other specified complication: Secondary | ICD-10-CM | POA: Diagnosis not present

## 2019-05-27 DIAGNOSIS — M4727 Other spondylosis with radiculopathy, lumbosacral region: Secondary | ICD-10-CM | POA: Diagnosis not present

## 2019-05-27 DIAGNOSIS — Z6827 Body mass index (BMI) 27.0-27.9, adult: Secondary | ICD-10-CM | POA: Diagnosis not present

## 2019-06-25 DIAGNOSIS — M4727 Other spondylosis with radiculopathy, lumbosacral region: Secondary | ICD-10-CM | POA: Diagnosis not present

## 2019-06-25 DIAGNOSIS — E1169 Type 2 diabetes mellitus with other specified complication: Secondary | ICD-10-CM | POA: Diagnosis not present

## 2019-06-25 DIAGNOSIS — I10 Essential (primary) hypertension: Secondary | ICD-10-CM | POA: Diagnosis not present

## 2019-06-25 DIAGNOSIS — E7800 Pure hypercholesterolemia, unspecified: Secondary | ICD-10-CM | POA: Diagnosis not present

## 2019-08-27 DIAGNOSIS — E663 Overweight: Secondary | ICD-10-CM | POA: Diagnosis not present

## 2019-08-27 DIAGNOSIS — Z6827 Body mass index (BMI) 27.0-27.9, adult: Secondary | ICD-10-CM | POA: Diagnosis not present

## 2019-08-27 DIAGNOSIS — E1169 Type 2 diabetes mellitus with other specified complication: Secondary | ICD-10-CM | POA: Diagnosis not present

## 2019-09-04 DIAGNOSIS — M4727 Other spondylosis with radiculopathy, lumbosacral region: Secondary | ICD-10-CM | POA: Diagnosis not present

## 2019-09-04 DIAGNOSIS — I10 Essential (primary) hypertension: Secondary | ICD-10-CM | POA: Diagnosis not present

## 2019-09-04 DIAGNOSIS — E119 Type 2 diabetes mellitus without complications: Secondary | ICD-10-CM | POA: Diagnosis not present

## 2019-09-04 DIAGNOSIS — E7800 Pure hypercholesterolemia, unspecified: Secondary | ICD-10-CM | POA: Diagnosis not present

## 2019-09-04 DIAGNOSIS — E1169 Type 2 diabetes mellitus with other specified complication: Secondary | ICD-10-CM | POA: Diagnosis not present

## 2019-09-08 DIAGNOSIS — D2262 Melanocytic nevi of left upper limb, including shoulder: Secondary | ICD-10-CM | POA: Diagnosis not present

## 2019-09-08 DIAGNOSIS — L821 Other seborrheic keratosis: Secondary | ICD-10-CM | POA: Diagnosis not present

## 2019-09-08 DIAGNOSIS — L738 Other specified follicular disorders: Secondary | ICD-10-CM | POA: Diagnosis not present

## 2019-09-08 DIAGNOSIS — L814 Other melanin hyperpigmentation: Secondary | ICD-10-CM | POA: Diagnosis not present

## 2019-09-08 DIAGNOSIS — L84 Corns and callosities: Secondary | ICD-10-CM | POA: Diagnosis not present

## 2019-09-08 DIAGNOSIS — D225 Melanocytic nevi of trunk: Secondary | ICD-10-CM | POA: Diagnosis not present

## 2019-09-08 DIAGNOSIS — D2261 Melanocytic nevi of right upper limb, including shoulder: Secondary | ICD-10-CM | POA: Diagnosis not present

## 2019-10-06 ENCOUNTER — Other Ambulatory Visit: Payer: Self-pay | Admitting: Obstetrics & Gynecology

## 2019-10-06 DIAGNOSIS — Z1231 Encounter for screening mammogram for malignant neoplasm of breast: Secondary | ICD-10-CM

## 2019-10-07 DIAGNOSIS — E1169 Type 2 diabetes mellitus with other specified complication: Secondary | ICD-10-CM | POA: Diagnosis not present

## 2019-10-07 DIAGNOSIS — E7800 Pure hypercholesterolemia, unspecified: Secondary | ICD-10-CM | POA: Diagnosis not present

## 2019-10-07 DIAGNOSIS — E119 Type 2 diabetes mellitus without complications: Secondary | ICD-10-CM | POA: Diagnosis not present

## 2019-10-07 DIAGNOSIS — Z7984 Long term (current) use of oral hypoglycemic drugs: Secondary | ICD-10-CM | POA: Diagnosis not present

## 2019-10-07 DIAGNOSIS — M4727 Other spondylosis with radiculopathy, lumbosacral region: Secondary | ICD-10-CM | POA: Diagnosis not present

## 2019-10-07 DIAGNOSIS — I10 Essential (primary) hypertension: Secondary | ICD-10-CM | POA: Diagnosis not present

## 2019-10-21 ENCOUNTER — Ambulatory Visit: Payer: HMO | Attending: Critical Care Medicine

## 2019-10-21 DIAGNOSIS — Z23 Encounter for immunization: Secondary | ICD-10-CM

## 2019-10-21 NOTE — Progress Notes (Signed)
   Covid-19 Vaccination Clinic  Name:  Angela Choi    MRN: [id number] DOB: 05-17-1950  10/21/2019  [name] was observed post Covid-19 immunization for 15 minutes without incident. She was provided with Vaccine Information Sheet and instruction to access the V-Safe system.   [name] was instructed to call 911 with any severe reactions post vaccine: Marland Kitchen Difficulty breathing  . Swelling of face and throat  . A fast heartbeat  . A bad rash all over body  . Dizziness and weakness

## 2019-10-24 ENCOUNTER — Ambulatory Visit: Payer: HMO

## 2019-10-24 DIAGNOSIS — E7800 Pure hypercholesterolemia, unspecified: Secondary | ICD-10-CM | POA: Diagnosis not present

## 2019-10-24 DIAGNOSIS — N951 Menopausal and female climacteric states: Secondary | ICD-10-CM | POA: Diagnosis not present

## 2019-10-24 DIAGNOSIS — Z6828 Body mass index (BMI) 28.0-28.9, adult: Secondary | ICD-10-CM | POA: Diagnosis not present

## 2019-10-24 DIAGNOSIS — E1169 Type 2 diabetes mellitus with other specified complication: Secondary | ICD-10-CM | POA: Diagnosis not present

## 2019-10-24 DIAGNOSIS — E663 Overweight: Secondary | ICD-10-CM | POA: Diagnosis not present

## 2019-10-24 DIAGNOSIS — Z0000 Encounter for general adult medical examination without abnormal findings: Secondary | ICD-10-CM | POA: Diagnosis not present

## 2019-10-24 DIAGNOSIS — I10 Essential (primary) hypertension: Secondary | ICD-10-CM | POA: Diagnosis not present

## 2019-10-31 DIAGNOSIS — M4727 Other spondylosis with radiculopathy, lumbosacral region: Secondary | ICD-10-CM | POA: Diagnosis not present

## 2019-10-31 DIAGNOSIS — I10 Essential (primary) hypertension: Secondary | ICD-10-CM | POA: Diagnosis not present

## 2019-10-31 DIAGNOSIS — E1169 Type 2 diabetes mellitus with other specified complication: Secondary | ICD-10-CM | POA: Diagnosis not present

## 2019-10-31 DIAGNOSIS — E7800 Pure hypercholesterolemia, unspecified: Secondary | ICD-10-CM | POA: Diagnosis not present

## 2019-10-31 DIAGNOSIS — E119 Type 2 diabetes mellitus without complications: Secondary | ICD-10-CM | POA: Diagnosis not present

## 2019-11-06 ENCOUNTER — Other Ambulatory Visit: Payer: Self-pay

## 2019-11-06 NOTE — Patient Outreach (Addendum)
  [facility]) Care Management Chronic Special Needs Program    11/06/2019  Name: Angela Choi, DOB: Jun 03, 1950  MRN: [id number]   Ms. Angela Choi is enrolled in a chronic special needs plan.  Member will be followed by the [facility] case management team beginning January 10, 2020.  Case is closed by [facility] management, will reopen if needed before January 10, 2020.    Addendum: [facility] will continue to follow client through 01/09/20.  Client will be followed by the [facility] case management team beginning January 10, 2020.  Case is closed by [facility] management, will reopen if needed before January 10, 2020.   Quinn Plowman RN,BSN,CCM [facility] [phone]

## 2019-11-12 DIAGNOSIS — H524 Presbyopia: Secondary | ICD-10-CM | POA: Diagnosis not present

## 2019-11-12 DIAGNOSIS — H25813 Combined forms of age-related cataract, bilateral: Secondary | ICD-10-CM | POA: Diagnosis not present

## 2019-11-12 DIAGNOSIS — H40053 Ocular hypertension, bilateral: Secondary | ICD-10-CM | POA: Diagnosis not present

## 2019-11-12 DIAGNOSIS — H5203 Hypermetropia, bilateral: Secondary | ICD-10-CM | POA: Diagnosis not present

## 2019-11-12 DIAGNOSIS — H52223 Regular astigmatism, bilateral: Secondary | ICD-10-CM | POA: Diagnosis not present

## 2019-11-12 DIAGNOSIS — E119 Type 2 diabetes mellitus without complications: Secondary | ICD-10-CM | POA: Diagnosis not present

## 2019-11-13 ENCOUNTER — Other Ambulatory Visit: Payer: Self-pay

## 2019-11-13 NOTE — Patient Outreach (Signed)
  [facility]) Care Management Chronic Special Needs Program    11/13/2019  Name: Angela Choi, DOB: 1950/04/30  MRN: [id number]  [facility] care management team has assumed care and services for this member.  Case Closed by [facility] care management   Quinn Plowman RN,BSN,CCM [facility] [phone]

## 2019-11-28 DIAGNOSIS — I10 Essential (primary) hypertension: Secondary | ICD-10-CM | POA: Diagnosis not present

## 2019-11-28 DIAGNOSIS — Z0000 Encounter for general adult medical examination without abnormal findings: Secondary | ICD-10-CM | POA: Diagnosis not present

## 2019-11-28 DIAGNOSIS — E1169 Type 2 diabetes mellitus with other specified complication: Secondary | ICD-10-CM | POA: Diagnosis not present

## 2019-11-28 DIAGNOSIS — N951 Menopausal and female climacteric states: Secondary | ICD-10-CM | POA: Diagnosis not present

## 2019-11-28 DIAGNOSIS — E7800 Pure hypercholesterolemia, unspecified: Secondary | ICD-10-CM | POA: Diagnosis not present

## 2019-11-28 DIAGNOSIS — E663 Overweight: Secondary | ICD-10-CM | POA: Diagnosis not present

## 2019-11-28 DIAGNOSIS — E119 Type 2 diabetes mellitus without complications: Secondary | ICD-10-CM | POA: Diagnosis not present

## 2019-11-28 DIAGNOSIS — Z6828 Body mass index (BMI) 28.0-28.9, adult: Secondary | ICD-10-CM | POA: Diagnosis not present

## 2019-12-03 DIAGNOSIS — E119 Type 2 diabetes mellitus without complications: Secondary | ICD-10-CM | POA: Diagnosis not present

## 2019-12-03 DIAGNOSIS — M4727 Other spondylosis with radiculopathy, lumbosacral region: Secondary | ICD-10-CM | POA: Diagnosis not present

## 2019-12-03 DIAGNOSIS — I10 Essential (primary) hypertension: Secondary | ICD-10-CM | POA: Diagnosis not present

## 2019-12-03 DIAGNOSIS — E7800 Pure hypercholesterolemia, unspecified: Secondary | ICD-10-CM | POA: Diagnosis not present

## 2019-12-03 DIAGNOSIS — E1169 Type 2 diabetes mellitus with other specified complication: Secondary | ICD-10-CM | POA: Diagnosis not present

## 2019-12-08 ENCOUNTER — Ambulatory Visit
Admission: RE | Admit: 2019-12-08 | Discharge: 2019-12-08 | Disposition: A | Discharge status: Home / Self Care | Payer: HMO | Source: Ambulatory Visit | Attending: Obstetrics & Gynecology | Admitting: Obstetrics & Gynecology

## 2019-12-08 ENCOUNTER — Other Ambulatory Visit: Payer: Self-pay

## 2019-12-08 DIAGNOSIS — Z1231 Encounter for screening mammogram for malignant neoplasm of breast: Secondary | ICD-10-CM | POA: Diagnosis not present

## 2019-12-17 ENCOUNTER — Encounter: Payer: PPO | Admitting: Obstetrics & Gynecology

## 2019-12-30 ENCOUNTER — Encounter: Payer: Self-pay | Admitting: Obstetrics & Gynecology

## 2019-12-30 ENCOUNTER — Other Ambulatory Visit: Payer: Self-pay

## 2019-12-30 ENCOUNTER — Ambulatory Visit (INDEPENDENT_AMBULATORY_CARE_PROVIDER_SITE_OTHER): Payer: HMO | Admitting: Obstetrics & Gynecology

## 2019-12-30 VITALS — BP 134/80 | Ht 65.0 in | Wt 165.0 lb

## 2019-12-30 DIAGNOSIS — Z01419 Encounter for gynecological examination (general) (routine) without abnormal findings: Secondary | ICD-10-CM | POA: Diagnosis not present

## 2019-12-30 DIAGNOSIS — Z780 Asymptomatic menopausal state: Secondary | ICD-10-CM

## 2019-12-30 DIAGNOSIS — Z90710 Acquired absence of both cervix and uterus: Secondary | ICD-10-CM

## 2019-12-30 NOTE — Progress Notes (Signed)
Nguyen Salaiz Nov 14, 1950 [id number]   History:    69 y.o. G2P2L2 Married  RP:  Established patient presenting for annual gyn exam   HPI: S/P Total Hysterectomy.  Post menopause on no HRT x 05/2017.  Started on Fluoxetine by Fam MD to control mood swings and hot flushes.  No pelvic pain.  No pain with IC.  Urine/BMs normal.  Doing Kegels.  Breasts normal.  BMI decreased to 27.46.  Walking.  Health labs with [facility], 5 yr schedule.   Past medical history,surgical history, family history and social history were all reviewed and documented in the EPIC chart.  Gynecologic History No LMP recorded. Patient has had a hysterectomy.  Obstetric History OB History  Gravida Para Term Preterm AB Living  2 2 2     2   SAB IAB Ectopic Multiple Live Births               # Outcome Date GA Lbr Len/2nd Weight Sex Delivery Anes PTL Lv  2 Term           1 Term              ROS: A ROS was performed and pertinent positives and negatives are included in the history.  GENERAL: No fevers or chills. HEENT: No change in vision, no earache, sore throat or sinus congestion. NECK: No pain or stiffness. CARDIOVASCULAR: No chest pain or pressure. No palpitations. PULMONARY: No shortness of breath, cough or wheeze. GASTROINTESTINAL: No abdominal pain, nausea, vomiting or diarrhea, melena or bright red blood per rectum. GENITOURINARY: No urinary frequency, urgency, hesitancy or dysuria. MUSCULOSKELETAL: No joint or muscle pain, no back pain, no recent trauma. DERMATOLOGIC: No rash, no itching, no lesions. ENDOCRINE: No polyuria, polydipsia, no heat or cold intolerance. No recent change in weight. HEMATOLOGICAL: No anemia or easy bruising or bleeding. NEUROLOGIC: No headache, seizures, numbness, tingling or weakness. PSYCHIATRIC: No depression, no loss of interest in normal activity or change in sleep pattern.     Exam:   Wt 165 lb (74.8 kg)   BMI 27.46 kg/m   Body mass index is 27.46  kg/m.  General appearance : Well developed well nourished female. No acute distress HEENT: Eyes: no retinal hemorrhage or exudates,  Neck supple, trachea midline, no carotid bruits, no thyroidmegaly Lungs: Clear to auscultation, no rhonchi or wheezes, or rib retractions  Heart: Regular rate and rhythm, no murmurs or gallops Breast:Examined in sitting and supine position were symmetrical in appearance, no palpable masses or tenderness,  no skin retraction, no nipple inversion, no nipple discharge, no skin discoloration, no axillary or supraclavicular lymphadenopathy Abdomen: no palpable masses or tenderness, no rebound or guarding Extremities: no edema or skin discoloration or tenderness  Pelvic: Vulva: Normal             Vagina: No gross lesions or discharge  Cervix/Uterus absent  Adnexa  Without masses or tenderness  Anus: Normal   Assessment/Plan:  69 y.o. female for annual exam   1. Well female exam with routine gynecological exam Gynecologic exam status post total hysterectomy. Pap test last year negative, no indication to repeat this year. Breast exam normal. Screening mammogram November 2021 was negative. Colonoscopy in 2020. Health labs with family physician. Improved body mass index now at 27.46. Continue with low calorie/carb diet. Continue with fitness.  2. S/P total hysterectomy  3. Postmenopause Well on no hormone replacement therapy. Vitamin D supplements, calcium intake of 1500 mg daily  and regular weightbearing physical activities to continue.  Princess Bruins MD, 3:23 PM 12/30/2019

## 2020-01-21 DIAGNOSIS — E7800 Pure hypercholesterolemia, unspecified: Secondary | ICD-10-CM | POA: Diagnosis not present

## 2020-01-21 DIAGNOSIS — E119 Type 2 diabetes mellitus without complications: Secondary | ICD-10-CM | POA: Diagnosis not present

## 2020-01-21 DIAGNOSIS — I10 Essential (primary) hypertension: Secondary | ICD-10-CM | POA: Diagnosis not present

## 2020-01-21 DIAGNOSIS — E1169 Type 2 diabetes mellitus with other specified complication: Secondary | ICD-10-CM | POA: Diagnosis not present

## 2020-01-21 DIAGNOSIS — M4727 Other spondylosis with radiculopathy, lumbosacral region: Secondary | ICD-10-CM | POA: Diagnosis not present

## 2020-02-12 DIAGNOSIS — E7800 Pure hypercholesterolemia, unspecified: Secondary | ICD-10-CM | POA: Diagnosis not present

## 2020-02-12 DIAGNOSIS — I10 Essential (primary) hypertension: Secondary | ICD-10-CM | POA: Diagnosis not present

## 2020-02-12 DIAGNOSIS — M4727 Other spondylosis with radiculopathy, lumbosacral region: Secondary | ICD-10-CM | POA: Diagnosis not present

## 2020-02-12 DIAGNOSIS — E1169 Type 2 diabetes mellitus with other specified complication: Secondary | ICD-10-CM | POA: Diagnosis not present

## 2020-02-12 DIAGNOSIS — E119 Type 2 diabetes mellitus without complications: Secondary | ICD-10-CM | POA: Diagnosis not present

## 2020-02-24 ENCOUNTER — Ambulatory Visit: Payer: HMO

## 2020-03-03 DIAGNOSIS — L570 Actinic keratosis: Secondary | ICD-10-CM | POA: Diagnosis not present

## 2020-03-03 DIAGNOSIS — E1169 Type 2 diabetes mellitus with other specified complication: Secondary | ICD-10-CM | POA: Diagnosis not present

## 2020-03-03 DIAGNOSIS — D485 Neoplasm of uncertain behavior of skin: Secondary | ICD-10-CM | POA: Diagnosis not present

## 2020-03-11 ENCOUNTER — Ambulatory Visit: Payer: HMO

## 2020-04-06 DIAGNOSIS — I10 Essential (primary) hypertension: Secondary | ICD-10-CM | POA: Diagnosis not present

## 2020-04-06 DIAGNOSIS — E7800 Pure hypercholesterolemia, unspecified: Secondary | ICD-10-CM | POA: Diagnosis not present

## 2020-04-06 DIAGNOSIS — M4727 Other spondylosis with radiculopathy, lumbosacral region: Secondary | ICD-10-CM | POA: Diagnosis not present

## 2020-04-06 DIAGNOSIS — E1169 Type 2 diabetes mellitus with other specified complication: Secondary | ICD-10-CM | POA: Diagnosis not present

## 2020-04-06 DIAGNOSIS — E119 Type 2 diabetes mellitus without complications: Secondary | ICD-10-CM | POA: Diagnosis not present

## 2020-05-04 DIAGNOSIS — E1169 Type 2 diabetes mellitus with other specified complication: Secondary | ICD-10-CM | POA: Diagnosis not present

## 2020-05-04 DIAGNOSIS — I10 Essential (primary) hypertension: Secondary | ICD-10-CM | POA: Diagnosis not present

## 2020-05-04 DIAGNOSIS — E7800 Pure hypercholesterolemia, unspecified: Secondary | ICD-10-CM | POA: Diagnosis not present

## 2020-05-04 DIAGNOSIS — E119 Type 2 diabetes mellitus without complications: Secondary | ICD-10-CM | POA: Diagnosis not present

## 2020-05-04 DIAGNOSIS — M4727 Other spondylosis with radiculopathy, lumbosacral region: Secondary | ICD-10-CM | POA: Diagnosis not present

## 2020-05-11 DIAGNOSIS — H524 Presbyopia: Secondary | ICD-10-CM | POA: Diagnosis not present

## 2020-05-11 DIAGNOSIS — H5203 Hypermetropia, bilateral: Secondary | ICD-10-CM | POA: Diagnosis not present

## 2020-05-11 DIAGNOSIS — H2510 Age-related nuclear cataract, unspecified eye: Secondary | ICD-10-CM | POA: Diagnosis not present

## 2020-05-11 DIAGNOSIS — H52223 Regular astigmatism, bilateral: Secondary | ICD-10-CM | POA: Diagnosis not present

## 2020-07-30 DIAGNOSIS — E1169 Type 2 diabetes mellitus with other specified complication: Secondary | ICD-10-CM | POA: Diagnosis not present

## 2020-07-30 DIAGNOSIS — E7800 Pure hypercholesterolemia, unspecified: Secondary | ICD-10-CM | POA: Diagnosis not present

## 2020-07-30 DIAGNOSIS — I10 Essential (primary) hypertension: Secondary | ICD-10-CM | POA: Diagnosis not present

## 2020-07-30 DIAGNOSIS — M4727 Other spondylosis with radiculopathy, lumbosacral region: Secondary | ICD-10-CM | POA: Diagnosis not present

## 2020-08-10 ENCOUNTER — Other Ambulatory Visit: Payer: Self-pay | Admitting: Orthopedic Surgery

## 2020-08-10 DIAGNOSIS — M67431 Ganglion, right wrist: Secondary | ICD-10-CM | POA: Diagnosis not present

## 2020-08-10 DIAGNOSIS — M25531 Pain in right wrist: Secondary | ICD-10-CM | POA: Diagnosis not present

## 2020-08-10 DIAGNOSIS — R2231 Localized swelling, mass and lump, right upper limb: Secondary | ICD-10-CM | POA: Diagnosis not present

## 2020-08-13 DIAGNOSIS — M4727 Other spondylosis with radiculopathy, lumbosacral region: Secondary | ICD-10-CM | POA: Diagnosis not present

## 2020-08-13 DIAGNOSIS — E1169 Type 2 diabetes mellitus with other specified complication: Secondary | ICD-10-CM | POA: Diagnosis not present

## 2020-08-13 DIAGNOSIS — I10 Essential (primary) hypertension: Secondary | ICD-10-CM | POA: Diagnosis not present

## 2020-08-13 DIAGNOSIS — E7800 Pure hypercholesterolemia, unspecified: Secondary | ICD-10-CM | POA: Diagnosis not present

## 2020-08-18 DIAGNOSIS — M67431 Ganglion, right wrist: Secondary | ICD-10-CM | POA: Diagnosis not present

## 2020-08-18 DIAGNOSIS — M7989 Other specified soft tissue disorders: Secondary | ICD-10-CM | POA: Diagnosis not present

## 2020-08-31 ENCOUNTER — Other Ambulatory Visit: Payer: Self-pay | Admitting: Orthopedic Surgery

## 2020-08-31 DIAGNOSIS — M67431 Ganglion, right wrist: Secondary | ICD-10-CM | POA: Diagnosis not present

## 2020-09-17 ENCOUNTER — Other Ambulatory Visit: Payer: Self-pay

## 2020-09-17 ENCOUNTER — Encounter (HOSPITAL_BASED_OUTPATIENT_CLINIC_OR_DEPARTMENT_OTHER): Payer: Self-pay | Admitting: Orthopedic Surgery

## 2020-09-20 ENCOUNTER — Other Ambulatory Visit: Payer: Self-pay

## 2020-09-20 ENCOUNTER — Encounter (HOSPITAL_BASED_OUTPATIENT_CLINIC_OR_DEPARTMENT_OTHER)
Admission: RE | Admit: 2020-09-20 | Discharge: 2020-09-20 | Disposition: A | Discharge status: Home / Self Care | Payer: HMO | Source: Ambulatory Visit | Attending: Orthopedic Surgery | Admitting: Orthopedic Surgery

## 2020-09-20 DIAGNOSIS — Z01818 Encounter for other preprocedural examination: Secondary | ICD-10-CM | POA: Insufficient documentation

## 2020-09-20 LAB — BASIC METABOLIC PANEL
Anion gap: 10 (ref 5–15)
BUN: 13 mg/dL (ref 8–23)
CO2: 28 mmol/L (ref 22–32)
Calcium: 9.4 mg/dL (ref 8.9–10.3)
Chloride: 98 mmol/L (ref 98–111)
Creatinine, Ser: 0.71 mg/dL (ref 0.44–1.00)
GFR, Estimated: >60 mL/min (ref >60)
Glucose, Bld: 193 mg/dL — ABNORMAL HIGH (ref 70–99)
Potassium: 4.7 mmol/L (ref 3.5–5.1)
Sodium: 136 mmol/L (ref 135–145)

## 2020-09-20 NOTE — Progress Notes (Signed)

## 2020-09-23 ENCOUNTER — Ambulatory Visit (HOSPITAL_BASED_OUTPATIENT_CLINIC_OR_DEPARTMENT_OTHER): Payer: HMO | Admitting: Anesthesiology

## 2020-09-23 ENCOUNTER — Ambulatory Visit (HOSPITAL_BASED_OUTPATIENT_CLINIC_OR_DEPARTMENT_OTHER)
Admission: RE | Admit: 2020-09-23 | Discharge: 2020-09-23 | Disposition: A | Discharge status: Home / Self Care | Payer: HMO | Attending: Orthopedic Surgery | Admitting: Orthopedic Surgery

## 2020-09-23 ENCOUNTER — Encounter (HOSPITAL_BASED_OUTPATIENT_CLINIC_OR_DEPARTMENT_OTHER): Payer: Self-pay | Admitting: Orthopedic Surgery

## 2020-09-23 ENCOUNTER — Encounter (HOSPITAL_BASED_OUTPATIENT_CLINIC_OR_DEPARTMENT_OTHER)
Admission: RE | Disposition: A | Discharge status: Home / Self Care | Payer: Self-pay | Source: Home / Self Care | Attending: Orthopedic Surgery

## 2020-09-23 ENCOUNTER — Other Ambulatory Visit: Payer: Self-pay

## 2020-09-23 DIAGNOSIS — Z888 Allergy status to other drugs, medicaments and biological substances status: Secondary | ICD-10-CM | POA: Insufficient documentation

## 2020-09-23 DIAGNOSIS — I10 Essential (primary) hypertension: Secondary | ICD-10-CM | POA: Diagnosis not present

## 2020-09-23 DIAGNOSIS — M67431 Ganglion, right wrist: Secondary | ICD-10-CM | POA: Diagnosis not present

## 2020-09-23 DIAGNOSIS — Z881 Allergy status to other antibiotic agents status: Secondary | ICD-10-CM | POA: Insufficient documentation

## 2020-09-23 DIAGNOSIS — Z79899 Other long term (current) drug therapy: Secondary | ICD-10-CM | POA: Diagnosis not present

## 2020-09-23 DIAGNOSIS — Z7984 Long term (current) use of oral hypoglycemic drugs: Secondary | ICD-10-CM | POA: Diagnosis not present

## 2020-09-23 DIAGNOSIS — E119 Type 2 diabetes mellitus without complications: Secondary | ICD-10-CM | POA: Diagnosis not present

## 2020-09-23 HISTORY — DX: Family history of other specified conditions: Z84.89

## 2020-09-23 HISTORY — PX: GANGLION CYST EXCISION: SHX1691

## 2020-09-23 LAB — GLUCOSE, CAPILLARY
Glucose-Capillary: 116 mg/dL — ABNORMAL HIGH (ref 70–99)
Glucose-Capillary: 107 mg/dL — ABNORMAL HIGH (ref 70–99)

## 2020-09-23 SURGERY — EXCISION, GANGLION CYST, WRIST
Anesthesia: General | Site: Wrist | Laterality: Right

## 2020-09-23 MED ORDER — KETOROLAC TROMETHAMINE 30 MG/ML IJ SOLN: 30.0000 mg | Freq: Once | INTRAMUSCULAR | Status: DC | PRN

## 2020-09-23 MED ORDER — PROPOFOL 10 MG/ML IV BOLUS
INTRAVENOUS | Status: DC | PRN
  Administered 2020-09-23: 50 mg via INTRAVENOUS
  Administered 2020-09-23: 100 mg via INTRAVENOUS
  Administered 2020-09-23: 50 mg via INTRAVENOUS

## 2020-09-23 MED ORDER — ROCURONIUM BROMIDE 100 MG/10ML IV SOLN
INTRAVENOUS | Status: DC | PRN
  Administered 2020-09-23: 5 mg via INTRAVENOUS

## 2020-09-23 MED ORDER — EPHEDRINE SULFATE 50 MG/ML IJ SOLN
INTRAMUSCULAR | Status: DC | PRN
  Administered 2020-09-23: 10 mg via INTRAVENOUS

## 2020-09-23 MED ORDER — DEXAMETHASONE SODIUM PHOSPHATE 10 MG/ML IJ SOLN
INTRAMUSCULAR | Status: DC | PRN
  Administered 2020-09-23: 5 mg via INTRAVENOUS

## 2020-09-23 MED ORDER — FENTANYL CITRATE (PF) 100 MCG/2ML IJ SOLN
INTRAMUSCULAR | Status: AC
  Filled 2020-09-23: qty 2

## 2020-09-23 MED ORDER — AMISULPRIDE (ANTIEMETIC) 5 MG/2ML IV SOLN: 10.0000 mg | Freq: Once | INTRAVENOUS | Status: DC | PRN

## 2020-09-23 MED ORDER — OXYCODONE HCL 5 MG/5ML PO SOLN: 5.0000 mg | Freq: Once | ORAL | Status: DC | PRN

## 2020-09-23 MED ORDER — PHENYLEPHRINE HCL (PRESSORS) 10 MG/ML IV SOLN
INTRAVENOUS | Status: DC | PRN
  Administered 2020-09-23 (×2): 80 ug via INTRAVENOUS

## 2020-09-23 MED ORDER — FENTANYL CITRATE (PF) 100 MCG/2ML IJ SOLN
INTRAMUSCULAR | Status: DC | PRN
  Administered 2020-09-23 (×2): 25 ug via INTRAVENOUS
  Administered 2020-09-23: 50 ug via INTRAVENOUS

## 2020-09-23 MED ORDER — ONDANSETRON HCL 4 MG/2ML IJ SOLN
INTRAMUSCULAR | Status: DC | PRN
  Administered 2020-09-23: 4 mg via INTRAVENOUS

## 2020-09-23 MED ORDER — LIDOCAINE HCL (CARDIAC) PF 100 MG/5ML IV SOSY
PREFILLED_SYRINGE | INTRAVENOUS | Status: DC | PRN
  Administered 2020-09-23: 40 mg via INTRAVENOUS

## 2020-09-23 MED ORDER — OXYCODONE HCL 5 MG PO TABS: 5.0000 mg | ORAL_TABLET | Freq: Once | ORAL | Status: DC | PRN

## 2020-09-23 MED ORDER — HYDROCODONE-ACETAMINOPHEN 5-325 MG PO TABS: ORAL_TABLET | ORAL | 0 refills | Status: DC

## 2020-09-23 MED ORDER — FENTANYL CITRATE (PF) 100 MCG/2ML IJ SOLN: 25.0000 ug | INTRAMUSCULAR | Status: DC | PRN

## 2020-09-23 MED ORDER — 0.9 % SODIUM CHLORIDE (POUR BTL) OPTIME
TOPICAL | Status: DC | PRN
  Administered 2020-09-23: 50 mL

## 2020-09-23 MED ORDER — BUPIVACAINE HCL (PF) 0.25 % IJ SOLN
INTRAMUSCULAR | Status: DC | PRN
  Administered 2020-09-23: 10 mL

## 2020-09-23 MED ORDER — MIDAZOLAM HCL 2 MG/2ML IJ SOLN
INTRAMUSCULAR | Status: AC
  Filled 2020-09-23: qty 2

## 2020-09-23 MED ORDER — PROMETHAZINE HCL 25 MG/ML IJ SOLN: 6.2500 mg | INTRAMUSCULAR | Status: DC | PRN

## 2020-09-23 MED ORDER — LACTATED RINGERS IV SOLN: INTRAVENOUS | Status: DC

## 2020-09-23 MED ORDER — MIDAZOLAM HCL 5 MG/5ML IJ SOLN
INTRAMUSCULAR | Status: DC | PRN
Start: 2020-09-23 — End: 2020-09-23
  Administered 2020-09-23: 2 mg via INTRAVENOUS

## 2020-09-23 MED ORDER — CEFAZOLIN SODIUM-DEXTROSE 2-4 GM/100ML-% IV SOLN
INTRAVENOUS | Status: AC
  Filled 2020-09-23: qty 100

## 2020-09-23 MED ORDER — CEFAZOLIN SODIUM-DEXTROSE 2-4 GM/100ML-% IV SOLN
2.0000 g | INTRAVENOUS | Status: AC
  Administered 2020-09-23: 2 g via INTRAVENOUS

## 2020-09-23 SURGICAL SUPPLY — 49 items
APL PRP STRL LF DISP 70% ISPRP (MISCELLANEOUS) ×1
APL SKNCLS STERI-STRIP NONHPOA (GAUZE/BANDAGES/DRESSINGS)
BENZOIN TINCTURE PRP APPL 2/3 (GAUZE/BANDAGES/DRESSINGS) IMPLANT
BLADE MINI RND TIP GREEN BEAV (BLADE) IMPLANT
BLADE SURG 15 STRL LF DISP TIS (BLADE) ×2 IMPLANT
BLADE SURG 15 STRL SS (BLADE) ×4
BNDG CMPR 9X4 STRL LF SNTH (GAUZE/BANDAGES/DRESSINGS) ×1
BNDG ELASTIC 2X5.8 VLCR STR LF (GAUZE/BANDAGES/DRESSINGS) IMPLANT
BNDG ELASTIC 3X5.8 VLCR STR LF (GAUZE/BANDAGES/DRESSINGS) ×2 IMPLANT
BNDG ESMARK 4X9 LF (GAUZE/BANDAGES/DRESSINGS) ×1 IMPLANT
BNDG GAUZE ELAST 4 BULKY (GAUZE/BANDAGES/DRESSINGS) ×2 IMPLANT
CHLORAPREP W/TINT 26 (MISCELLANEOUS) ×2 IMPLANT
CORD BIPOLAR FORCEPS 12FT (ELECTRODE) ×2 IMPLANT
COVER BACK TABLE 60X90IN (DRAPES) ×2 IMPLANT
COVER MAYO STAND STRL (DRAPES) ×2 IMPLANT
CUFF TOURN SGL QUICK 18X4 (TOURNIQUET CUFF) ×2 IMPLANT
DRAPE EXTREMITY T 121X128X90 (DISPOSABLE) ×2 IMPLANT
DRAPE SURG 17X23 STRL (DRAPES) ×2 IMPLANT
DRSG PAD ABDOMINAL 8X10 ST (GAUZE/BANDAGES/DRESSINGS) IMPLANT
GAUZE SPONGE 4X4 12PLY STRL (GAUZE/BANDAGES/DRESSINGS) ×2 IMPLANT
GAUZE XEROFORM 1X8 LF (GAUZE/BANDAGES/DRESSINGS) ×2 IMPLANT
GLOVE SRG 8 PF TXTR STRL LF DI (GLOVE) ×1 IMPLANT
GLOVE SURG ENC MOIS LTX SZ7 (GLOVE) ×1 IMPLANT
GLOVE SURG ENC MOIS LTX SZ7.5 (GLOVE) ×2 IMPLANT
GLOVE SURG ORTHO LTX SZ8 (GLOVE) ×1 IMPLANT
GLOVE SURG UNDER POLY LF SZ7 (GLOVE) ×1 IMPLANT
GLOVE SURG UNDER POLY LF SZ8 (GLOVE) ×2
GOWN STRL REUS W/ TWL LRG LVL3 (GOWN DISPOSABLE) ×1 IMPLANT
GOWN STRL REUS W/TWL LRG LVL3 (GOWN DISPOSABLE) ×2
GOWN STRL REUS W/TWL XL LVL3 (GOWN DISPOSABLE) ×3 IMPLANT
NDL HYPO 25X1 1.5 SAFETY (NEEDLE) IMPLANT
NEEDLE HYPO 25X1 1.5 SAFETY (NEEDLE) ×2 IMPLANT
NS IRRIG 1000ML POUR BTL (IV SOLUTION) ×2 IMPLANT
PACK BASIN DAY SURGERY FS (CUSTOM PROCEDURE TRAY) ×2 IMPLANT
PAD CAST 3X4 CTTN HI CHSV (CAST SUPPLIES) IMPLANT
PADDING CAST ABS 4INX4YD NS (CAST SUPPLIES) ×1
PADDING CAST ABS COTTON 4X4 ST (CAST SUPPLIES) ×1 IMPLANT
PADDING CAST COTTON 3X4 STRL (CAST SUPPLIES) ×2
SPLINT PLASTER CAST XFAST 3X15 (CAST SUPPLIES) IMPLANT
SPLINT PLASTER XTRA FASTSET 3X (CAST SUPPLIES)
STOCKINETTE 4X48 STRL (DRAPES) ×2 IMPLANT
STRIP CLOSURE SKIN 1/2X4 (GAUZE/BANDAGES/DRESSINGS) IMPLANT
SUT MNCRL AB 4-0 PS2 18 (SUTURE) IMPLANT
SUT MON AB 5-0 PS2 18 (SUTURE) IMPLANT
SUT VIC AB 4-0 P2 18 (SUTURE) ×1 IMPLANT
SYR BULB EAR ULCER 3OZ GRN STR (SYRINGE) ×2 IMPLANT
SYR CONTROL 10ML LL (SYRINGE) IMPLANT
TOWEL GREEN STERILE FF (TOWEL DISPOSABLE) ×4 IMPLANT
UNDERPAD 30X36 HEAVY ABSORB (UNDERPADS AND DIAPERS) ×2 IMPLANT

## 2020-09-23 NOTE — Transfer of Care (Signed)
Immediate Anesthesia Transfer of Care Note  Patient: Angela Choi  Procedure(s) Performed: RIGHT WRIST EXCISION VOLAR GANGLION (Right: Wrist)  Patient Location: PACU  Anesthesia Type:General  Level of Consciousness: sedated  Airway & Oxygen Therapy: Patient Spontanous Breathing and Patient connected to face mask oxygen  Post-op Assessment: Report given to RN and Post -op Vital signs reviewed and stable  Post vital signs: Reviewed and stable  Last Vitals:  Vitals Value Taken Time  BP    Temp    Pulse    Resp    SpO2      Last Pain:  Vitals:   09/23/20 0926  TempSrc: Oral  PainSc: 0-No pain      Patients Stated Pain Goal: 8 ([id number])  Complications: No notable events documented.

## 2020-09-23 NOTE — Anesthesia Procedure Notes (Signed)
Procedure Name: LMA Insertion Date/Time: 09/23/2020 11:42 AM Performed by: Willa Frater, CRNA Pre-anesthesia Checklist: Patient identified, Emergency Drugs available, Suction available and Patient being monitored Patient Re-evaluated:Patient Re-evaluated prior to induction Oxygen Delivery Method: Circle system utilized Preoxygenation: Pre-oxygenation with 100% oxygen Induction Type: IV induction Ventilation: Mask ventilation without difficulty LMA: LMA inserted LMA Size: 4.0 Number of attempts: 1 Airway Equipment and Method: Bite block Placement Confirmation: positive ETCO2 Tube secured with: Tape Dental Injury: Teeth and Oropharynx as per pre-operative assessment

## 2020-09-23 NOTE — Anesthesia Preprocedure Evaluation (Signed)
Anesthesia Evaluation  Patient identified by MRN, date of birth, ID band Patient awake    Reviewed: Allergy & Precautions, NPO status , Patient's Chart, lab work & pertinent test results  History of Anesthesia Complications (+) PROLONGED EMERGENCE, Family history of anesthesia reaction and history of anesthetic complications (slow to wake up from anesthesia. sister has PONV )  Airway Mallampati: II  TM Distance: >3 FB Neck ROM: Full    Dental no notable dental hx.    Pulmonary neg pulmonary ROS,    Pulmonary exam normal breath sounds clear to auscultation       Cardiovascular hypertension, Pt. on medications Normal cardiovascular exam Rhythm:Regular Rate:Normal     Neuro/Psych negative neurological ROS  negative psych ROS   GI/Hepatic negative GI ROS, Neg liver ROS,   Endo/Other  diabetes, Well Controlled, Type 2, Oral Hypoglycemic Agents  Renal/GU negative Renal ROS  negative genitourinary   Musculoskeletal negative musculoskeletal ROS (+)   Abdominal   Peds negative pediatric ROS (+)  Hematology negative hematology ROS (+)   Anesthesia Other Findings   Reproductive/Obstetrics negative OB ROS                             Anesthesia Physical Anesthesia Plan  ASA: 2  Anesthesia Plan: General   Post-op Pain Management:    Induction: Intravenous  PONV Risk Score and Plan:   Airway Management Planned:   Additional Equipment:   Intra-op Plan:   Post-operative Plan: Extubation in OR  Informed Consent: I have reviewed the patients History and Physical, chart, labs and discussed the procedure including the risks, benefits and alternatives for the proposed anesthesia with the patient or authorized representative who has indicated his/her understanding and acceptance.     Dental advisory given  Plan Discussed with: CRNA  Anesthesia Plan Comments:         Anesthesia  Quick Evaluation

## 2020-09-23 NOTE — H&P (Signed)
Angela Choi is an 70 y.o. female.   Chief Complaint: ganglion HPI: 70 yo female with mass right wrist.  It is bothersome to her.  Ultrasound consistent with ganglion cyst.  She wishes to have it removed.  Allergies:  Allergies  Allergen Reactions   Biaxin [Clarithromycin] Hives and Swelling   Metformin And Related Diarrhea    Past Medical History:  Diagnosis Date   Diabetes mellitus    Elevated cholesterol    Endometriosis    Family history of adverse reaction to anesthesia    yonger sister with PONV   Fibroid    Hypertension    Left bundle branch block (LBBB)    Rectocele    Uterine prolapse     Past Surgical History:  Procedure Laterality Date   BLADDER SUSPENSION     Sling   PELVIC LAPAROSCOPY     VAGINAL HYSTERECTOMY     Vag Hyst Post repair    Family History: Family History  Problem Relation Age of Onset   Diabetes Father    Heart disease Father        died 51 y/o   Diabetes Sister    Hypertension Sister    Uterine cancer Sister    Cancer Sister 72       uterine   Colon cancer Maternal Aunt    Cancer Maternal Aunt        colon   Gastric cancer Maternal Aunt    Hypertension Maternal Grandmother    Hypertension Maternal Grandfather    Hypertension Paternal Grandmother    Hypertension Paternal Grandfather    Breast cancer Neg Hx     Social History:   reports that she has never smoked. She has never used smokeless tobacco. She reports current alcohol use. She reports that she does not use drugs.  Medications: Medications Prior to Admission  Medication Sig Dispense Refill   atorvastatin (LIPITOR) 40 MG tablet Take 40 mg by mouth daily.     cholecalciferol (VITAMIN D) 1000 UNITS tablet Take 2 Units by mouth daily.     empagliflozin (JARDIANCE) 25 MG TABS tablet Take 1 tablet by mouth daily.     FLUoxetine (PROZAC) 20 MG capsule Take 20 mg by mouth daily.     glimepiride (AMARYL) 4 MG tablet Take 4 mg by mouth daily with breakfast.      hydrochlorothiazide (MICROZIDE) 12.5 MG capsule Take 12.5 mg by mouth daily.     quinapril (ACCUPRIL) 40 MG tablet Take 40 mg by mouth at bedtime.     Multiple Vitamin (MULTIVITAMIN) tablet Take 1 tablet by mouth daily.      Results for orders placed or performed during the hospital encounter of 09/23/20 (from the past 48 hour(s))  Glucose, capillary     Status: Abnormal   Collection Time: 09/23/20  9:43 AM  Result Value Ref Range   Glucose-Capillary 116 (H) 70 - 99 mg/dL    Comment: Glucose reference range applies only to samples taken after fasting for at least 8 hours.    No results found.     Blood pressure 135/70, pulse 82, temperature 97.8 F (36.6 C), temperature source Oral, resp. rate 18, height 5' 5.5" (1.664 m), weight 75.1 kg, SpO2 100 %.  General appearance: alert, cooperative, and appears stated age Head: Normocephalic, without obvious abnormality, atraumatic Neck: supple, symmetrical, trachea midline Cardio: regular rate and rhythm Resp: clear to auscultation bilaterally Extremities: Intact sensation and capillary refill all digits.  +epl/fpl/io.  No wounds.  Pulses:  2+ and symmetric Skin: Skin color, texture, turgor normal. No rashes or lesions Neurologic: Grossly normal Incision/Wound: none  Assessment/Plan Right wrist volar ganglion cyst.  Non operative and operative treatment options have been discussed with the patient and patient wishes to proceed with operative treatment. Risks, benefits, and alternatives of surgery have been discussed and the patient agrees with the plan of care.   Leanora Cover 09/23/2020, 10:21 AM

## 2020-09-23 NOTE — Anesthesia Postprocedure Evaluation (Signed)
Anesthesia Post Note  Patient: Angela Choi  Procedure(s) Performed: RIGHT WRIST EXCISION VOLAR GANGLION (Right: Wrist)     Patient location during evaluation: PACU Anesthesia Type: General Level of consciousness: awake and alert, oriented and patient cooperative Pain management: pain level controlled Vital Signs Assessment: post-procedure vital signs reviewed and stable Respiratory status: spontaneous breathing, nonlabored ventilation and respiratory function stable Cardiovascular status: blood pressure returned to baseline and stable Postop Assessment: no apparent nausea or vomiting Anesthetic complications: no   No notable events documented.  Last Vitals:  Vitals:   09/23/20 0926  BP: 135/70  Pulse: 82  Resp: 18  Temp: 36.6 C  SpO2: 100%    Last Pain:  Vitals:   09/23/20 0926  TempSrc: Oral  PainSc: 0-No pain                 Pervis Hocking

## 2020-09-23 NOTE — Op Note (Signed)
I assisted Surgeon(s) and Role:    * Leanora Cover, MD - Primary    * Daryll Brod, MD - Assisting on the Procedure(s): RIGHT WRIST [place] on 09/23/2020.  I provided assistance on this case as follows: setup, approach, identification isolation and protection of the radial artery,identification, and removal of the cyst, closure of the incision and application of the dressing.  Electronically signed by: Daryll Brod, MD Date: 09/23/2020 Time: 12:29 PM

## 2020-09-23 NOTE — Op Note (Signed)
NAME: [place] RECORD NO: [id number] DATE OF BIRTH: 02/17/1950 FACILITY: [facility] LOCATION: [facility] PHYSICIAN: Tennis Must, MD   OPERATIVE REPORT   DATE OF PROCEDURE: 09/23/20    PREOPERATIVE DIAGNOSIS: Right wrist volar ganglion   POSTOPERATIVE DIAGNOSIS: Right wrist volar ganglion   PROCEDURE: Excision volar ganglion right wrist   SURGEON:  Leanora Cover, M.D.   ASSISTANT: Daryll Brod, MD   ANESTHESIA:  General   INTRAVENOUS FLUIDS:  Per anesthesia flow sheet.   ESTIMATED BLOOD LOSS:  Minimal.   COMPLICATIONS:  None.   SPECIMENS: Ganglion cyst to pathology   TOURNIQUET TIME:    Total Tourniquet Time Documented: Upper Arm (Right) - 32 minutes Total: Upper Arm (Right) - 32 minutes    DISPOSITION:  Stable to PACU.   INDICATIONS: 70 year old female has noted a mass at the volar radial aspect of her wrist.  It is bothersome to her.  She wishes to have it removed.  Ultrasound is consistent with ganglion cyst.  Risks, benefits and alternatives of surgery were discussed including the risks of blood loss, infection, damage to nerves, vessels, tendons, ligaments, bone for surgery, need for additional surgery, complications with wound healing, continued pain, stiffness, recurrence.  She voiced understanding of these risks and elected to proceed.  OPERATIVE COURSE:  After being identified preoperatively by myself,  the patient and I agreed on the procedure and site of the procedure.  The surgical site was marked.  Surgical consent had been signed. She was given IV antibiotics as preoperative antibiotic prophylaxis. She was transferred to the operating room and placed on the operating table in supine position with the Right upper extremity on an arm board.  General anesthesia was induced by the anesthesiologist.  Right upper extremity was prepped and draped in normal sterile orthopedic fashion.  A surgical pause was performed between the surgeons,  anesthesia, and operating room staff and all were in agreement as to the patient, procedure, and site of procedure.  Tourniquet at the proximal aspect of the extremity was inflated to 250 mmHg after exsanguination of the arm with an Esmarch bandage.  Incision was made at the volar radial aspect of the wrist over the mass.  This was carried in subcutaneous tissues by spreading technique.  Bipolar electrocautery was used to obtain hemostasis.  The radial artery was identified and protected throughout the case.  The mass was directly underneath the branch point of the artery.  Both branches were carefully freed up and protected.  The mass was carefully freed up from surrounding soft tissues.  There was a stalk coming from the volar aspect of the radiocarpal joint.  The mass was removed and sent to pathology for examination.  4-0 Vicryl suture was used in a horizontal mattress fashion to repair the rent in the capsule.  The wound was copiously irrigated with sterile saline.  It was closed with 2 inverted interrupted 4-0 Vicryl sutures in the subcutaneous tissues and the skin was closed with a running subcuticular Monocryl suture.  This was augmented with benzoin and Steri-Strips.  Wound was injected with quarter percent plain Marcaine to aid in postoperative analgesia.  It was dressed with sterile 4 x 4's and ABD and wrapped with Kerlix and Ace bandage.  The tourniquet was deflated at 32 minutes.  Fingertips were pink with brisk capillary refill after deflation of tourniquet.  The operative  drapes were broken down.  The patient was awoken from anesthesia safely.  She was transferred  back to the stretcher and taken to PACU in stable condition.  I will see her back in the office in 1 week for postoperative followup.  I will give her a prescription for Norco 5/325 1-2 tabs PO q6 hours prn pain, dispense # 20.   Leanora Cover, MD Electronically signed, 09/23/20

## 2020-09-23 NOTE — Discharge Instructions (Addendum)
  Post Anesthesia Home Care Instructions  Activity: Get plenty of rest for the remainder of the day. A responsible individual must stay with you for 24 hours following the procedure.  For the next 24 hours, DO NOT: -Drive a car -Paediatric nurse -Drink alcoholic beverages -Take any medication unless instructed by your physician -Make any legal decisions or sign important papers.  Meals: Start with liquid foods such as gelatin or soup. Progress to regular foods as tolerated. Avoid greasy, spicy, heavy foods. If nausea and/or vomiting occur, drink only clear liquids until the nausea and/or vomiting subsides. Call your physician if vomiting continues.  Special Instructions/Symptoms: Your throat may feel dry or sore from the anesthesia or the breathing tube placed in your throat during surgery. If this causes discomfort, gargle with warm salt water. The discomfort should disappear within 24 hours.      [facility] Instructions Hand Surgery  Wound Care: Keep your hand elevated above the level of your heart.  Do not allow it to dangle by your side.  Keep the dressing dry and do not remove it unless your doctor advises you to do so.  He will usually change it at the time of your post-op visit.  Moving your fingers is advised to stimulate circulation but will depend on the site of your surgery.  If you have a splint applied, your doctor will advise you regarding movement.  Activity: Do not drive or operate machinery today.  Rest today and then you may return to your normal activity and work as indicated by your physician.  Diet:  Drink liquids today or eat a light diet.  You may resume a regular diet tomorrow.    General expectations: Pain for two to three days. Fingers may become slightly swollen.  Call your doctor if any of the following occur: Severe pain not relieved by pain medication. Elevated temperature. Dressing soaked with blood. Inability to move fingers. White or bluish  color to fingers.

## 2020-09-24 NOTE — Addendum Note (Signed)
Addendum  created 09/24/20 0844 by Ezequiel Kayser, CRNA   Charge Capture section accepted

## 2020-09-27 ENCOUNTER — Encounter (HOSPITAL_BASED_OUTPATIENT_CLINIC_OR_DEPARTMENT_OTHER): Payer: Self-pay | Admitting: Orthopedic Surgery

## 2020-09-27 LAB — SURGICAL PATHOLOGY

## 2020-10-22 DIAGNOSIS — E1169 Type 2 diabetes mellitus with other specified complication: Secondary | ICD-10-CM | POA: Diagnosis not present

## 2020-10-22 DIAGNOSIS — Z0000 Encounter for general adult medical examination without abnormal findings: Secondary | ICD-10-CM | POA: Diagnosis not present

## 2020-10-22 DIAGNOSIS — Z111 Encounter for screening for respiratory tuberculosis: Secondary | ICD-10-CM | POA: Diagnosis not present

## 2020-10-22 DIAGNOSIS — I10 Essential (primary) hypertension: Secondary | ICD-10-CM | POA: Diagnosis not present

## 2020-10-22 DIAGNOSIS — E7800 Pure hypercholesterolemia, unspecified: Secondary | ICD-10-CM | POA: Diagnosis not present

## 2020-10-29 ENCOUNTER — Other Ambulatory Visit: Payer: Self-pay | Admitting: Obstetrics & Gynecology

## 2020-10-29 DIAGNOSIS — E7800 Pure hypercholesterolemia, unspecified: Secondary | ICD-10-CM | POA: Diagnosis not present

## 2020-10-29 DIAGNOSIS — I10 Essential (primary) hypertension: Secondary | ICD-10-CM | POA: Diagnosis not present

## 2020-10-29 DIAGNOSIS — Z1231 Encounter for screening mammogram for malignant neoplasm of breast: Secondary | ICD-10-CM

## 2020-10-29 DIAGNOSIS — Z0000 Encounter for general adult medical examination without abnormal findings: Secondary | ICD-10-CM | POA: Diagnosis not present

## 2020-10-29 DIAGNOSIS — Z1389 Encounter for screening for other disorder: Secondary | ICD-10-CM | POA: Diagnosis not present

## 2020-10-29 DIAGNOSIS — E1169 Type 2 diabetes mellitus with other specified complication: Secondary | ICD-10-CM | POA: Diagnosis not present

## 2020-10-29 DIAGNOSIS — N951 Menopausal and female climacteric states: Secondary | ICD-10-CM | POA: Diagnosis not present

## 2020-10-29 DIAGNOSIS — Z23 Encounter for immunization: Secondary | ICD-10-CM | POA: Diagnosis not present

## 2020-10-29 DIAGNOSIS — E663 Overweight: Secondary | ICD-10-CM | POA: Diagnosis not present

## 2020-11-04 DIAGNOSIS — M4727 Other spondylosis with radiculopathy, lumbosacral region: Secondary | ICD-10-CM | POA: Diagnosis not present

## 2020-11-04 DIAGNOSIS — I10 Essential (primary) hypertension: Secondary | ICD-10-CM | POA: Diagnosis not present

## 2020-11-04 DIAGNOSIS — E7800 Pure hypercholesterolemia, unspecified: Secondary | ICD-10-CM | POA: Diagnosis not present

## 2020-11-04 DIAGNOSIS — E1169 Type 2 diabetes mellitus with other specified complication: Secondary | ICD-10-CM | POA: Diagnosis not present

## 2020-11-12 DIAGNOSIS — Z029 Encounter for administrative examinations, unspecified: Secondary | ICD-10-CM | POA: Diagnosis not present

## 2020-11-25 DIAGNOSIS — H40053 Ocular hypertension, bilateral: Secondary | ICD-10-CM | POA: Diagnosis not present

## 2020-11-25 DIAGNOSIS — H5203 Hypermetropia, bilateral: Secondary | ICD-10-CM | POA: Diagnosis not present

## 2020-11-25 DIAGNOSIS — H52223 Regular astigmatism, bilateral: Secondary | ICD-10-CM | POA: Diagnosis not present

## 2020-11-25 DIAGNOSIS — H2513 Age-related nuclear cataract, bilateral: Secondary | ICD-10-CM | POA: Diagnosis not present

## 2020-11-25 DIAGNOSIS — H524 Presbyopia: Secondary | ICD-10-CM | POA: Diagnosis not present

## 2020-11-25 DIAGNOSIS — E119 Type 2 diabetes mellitus without complications: Secondary | ICD-10-CM | POA: Diagnosis not present

## 2020-12-14 ENCOUNTER — Ambulatory Visit
Admission: RE | Admit: 2020-12-14 | Discharge: 2020-12-14 | Disposition: A | Discharge status: Home / Self Care | Payer: HMO | Source: Ambulatory Visit | Attending: Obstetrics & Gynecology | Admitting: Obstetrics & Gynecology

## 2020-12-14 ENCOUNTER — Other Ambulatory Visit: Payer: Self-pay

## 2020-12-14 DIAGNOSIS — Z1231 Encounter for screening mammogram for malignant neoplasm of breast: Secondary | ICD-10-CM

## 2020-12-30 ENCOUNTER — Ambulatory Visit: Payer: Self-pay | Admitting: Obstetrics & Gynecology

## 2021-01-13 DIAGNOSIS — D225 Melanocytic nevi of trunk: Secondary | ICD-10-CM | POA: Diagnosis not present

## 2021-01-13 DIAGNOSIS — L905 Scar conditions and fibrosis of skin: Secondary | ICD-10-CM | POA: Diagnosis not present

## 2021-01-13 DIAGNOSIS — D485 Neoplasm of uncertain behavior of skin: Secondary | ICD-10-CM | POA: Diagnosis not present

## 2021-01-13 DIAGNOSIS — L821 Other seborrheic keratosis: Secondary | ICD-10-CM | POA: Diagnosis not present

## 2021-01-13 DIAGNOSIS — D1801 Hemangioma of skin and subcutaneous tissue: Secondary | ICD-10-CM | POA: Diagnosis not present

## 2021-01-13 DIAGNOSIS — L814 Other melanin hyperpigmentation: Secondary | ICD-10-CM | POA: Diagnosis not present

## 2021-01-24 ENCOUNTER — Other Ambulatory Visit: Payer: Self-pay

## 2021-01-24 ENCOUNTER — Ambulatory Visit (INDEPENDENT_AMBULATORY_CARE_PROVIDER_SITE_OTHER): Payer: HMO | Admitting: Obstetrics & Gynecology

## 2021-01-24 ENCOUNTER — Encounter: Payer: Self-pay | Admitting: Obstetrics & Gynecology

## 2021-01-24 VITALS — BP 134/70 | HR 84 | Resp 20 | Ht 65.55 in | Wt 166.0 lb

## 2021-01-24 DIAGNOSIS — Z13820 Encounter for screening for osteoporosis: Secondary | ICD-10-CM

## 2021-01-24 DIAGNOSIS — Z90710 Acquired absence of both cervix and uterus: Secondary | ICD-10-CM | POA: Diagnosis not present

## 2021-01-24 DIAGNOSIS — Z780 Asymptomatic menopausal state: Secondary | ICD-10-CM

## 2021-01-24 DIAGNOSIS — Z01419 Encounter for gynecological examination (general) (routine) without abnormal findings: Secondary | ICD-10-CM | POA: Diagnosis not present

## 2021-01-24 NOTE — Progress Notes (Signed)
Angela Choi [birth year] [id number]   History:    71 y.o. G2P2L2 Married   RP:  Established patient presenting for annual gyn exam    HPI: S/P Total Hysterectomy.  Post menopause on no HRT x 05/2017.  Started on Fluoxetine by Fam MD to control mood swings and hot flushes.  No pelvic pain.  No pain with IC.  Pap 08/2018 Neg.  Urine/BMs normal.  Doing Kegels.  Breasts normal.  Mammo 12/2020 Neg.  BMI decreased to 27.16.  Walking.  Health labs with [place], 5 yr schedule.   BMD: 09/05/16 normal, will repeat a BD 08/2021.  Past medical history,surgical history, family history and social history were all reviewed and documented in the EPIC chart.  Gynecologic History No LMP recorded. Patient has had a hysterectomy.  Obstetric History OB History  Gravida Para Term Preterm AB Living  2 2 2     2   SAB IAB Ectopic Multiple Live Births               # Outcome Date GA Lbr Len/2nd Weight Sex Delivery Anes PTL Lv  2 Term           1 Term              ROS: A ROS was performed and pertinent positives and negatives are included in the history.  GENERAL: No fevers or chills. HEENT: No change in vision, no earache, sore throat or sinus congestion. NECK: No pain or stiffness. CARDIOVASCULAR: No chest pain or pressure. No palpitations. PULMONARY: No shortness of breath, cough or wheeze. GASTROINTESTINAL: No abdominal pain, nausea, vomiting or diarrhea, melena or bright red blood per rectum. GENITOURINARY: No urinary frequency, urgency, hesitancy or dysuria. MUSCULOSKELETAL: No joint or muscle pain, no back pain, no recent trauma. DERMATOLOGIC: No rash, no itching, no lesions. ENDOCRINE: No polyuria, polydipsia, no heat or cold intolerance. No recent change in weight. HEMATOLOGICAL: No anemia or easy bruising or bleeding. NEUROLOGIC: No headache, seizures, numbness, tingling or weakness. PSYCHIATRIC: No depression, no loss of interest in normal activity or change in sleep pattern.      Exam:   BP 134/70 (BP Location: Right Arm)    Pulse 84    Resp 20    Ht 5' 5.55" (1.665 m)    Wt 166 lb (75.3 kg)    SpO2 97%    BMI 27.16 kg/m   Body mass index is 27.16 kg/m.  General appearance : Well developed well nourished female. No acute distress HEENT: Eyes: no retinal hemorrhage or exudates,  Neck supple, trachea midline, no carotid bruits, no thyroidmegaly Lungs: Clear to auscultation, no rhonchi or wheezes, or rib retractions  Heart: Regular rate and rhythm, no murmurs or gallops Breast:Examined in sitting and supine position were symmetrical in appearance, no palpable masses or tenderness,  no skin retraction, no nipple inversion, no nipple discharge, no skin discoloration, no axillary or supraclavicular lymphadenopathy Abdomen: no palpable masses or tenderness, no rebound or guarding Extremities: no edema or skin discoloration or tenderness  Pelvic: Vulva: Normal             Vagina: No gross lesions or discharge  Cervix/Uterus absent  Adnexa  Without masses or tenderness  Anus: Normal   Assessment/Plan:  71 y.o. female for annual exam   1. Well female exam with routine gynecological exam S/P Total Hysterectomy.  Post menopause on no HRT x 05/2017.  Started on Fluoxetine by Fam MD to control  mood swings and hot flushes.  No pelvic pain.  No pain with IC.  Pap 08/2018 Neg.  Urine/BMs normal.  Doing Kegels.  Breasts normal.  Mammo 12/2020 Neg.  BMI decreased to 27.16.  Walking.  Health labs with [place], 5 yr schedule.   BMD: 09/05/16 normal, will repeat a BD 08/2021.  2. S/P total hysterectomy  3. Postmenopause S/P Total Hysterectomy.  Post menopause on no HRT x 05/2017.  Started on Fluoxetine by Fam MD to control mood swings and hot flushes.  No pelvic pain.  No pain with IC.    4. Screening for osteoporosis Vitamin D supplement, Ca++ 1.5 g/d total, regular weight bearing physical activities. - DG Bone Density; Future  Other orders - lisinopril  (ZESTRIL) 40 MG tablet; 1 tablet   Princess Bruins MD, 9:51 AM 01/24/2021

## 2021-01-31 DIAGNOSIS — M25531 Pain in right wrist: Secondary | ICD-10-CM | POA: Diagnosis not present

## 2021-01-31 DIAGNOSIS — M654 Radial styloid tenosynovitis [de Quervain]: Secondary | ICD-10-CM | POA: Diagnosis not present

## 2021-01-31 DIAGNOSIS — G5601 Carpal tunnel syndrome, right upper limb: Secondary | ICD-10-CM | POA: Diagnosis not present

## 2021-02-09 DIAGNOSIS — G5611 Other lesions of median nerve, right upper limb: Secondary | ICD-10-CM | POA: Diagnosis not present

## 2021-02-14 ENCOUNTER — Other Ambulatory Visit: Payer: Self-pay | Admitting: Orthopedic Surgery

## 2021-02-14 DIAGNOSIS — G5601 Carpal tunnel syndrome, right upper limb: Secondary | ICD-10-CM | POA: Diagnosis not present

## 2021-02-14 DIAGNOSIS — M654 Radial styloid tenosynovitis [de Quervain]: Secondary | ICD-10-CM | POA: Diagnosis not present

## 2021-02-24 ENCOUNTER — Encounter (HOSPITAL_BASED_OUTPATIENT_CLINIC_OR_DEPARTMENT_OTHER): Payer: Self-pay | Admitting: Orthopedic Surgery

## 2021-03-01 ENCOUNTER — Encounter (HOSPITAL_BASED_OUTPATIENT_CLINIC_OR_DEPARTMENT_OTHER)
Admission: RE | Admit: 2021-03-01 | Discharge: 2021-03-01 | Disposition: A | Discharge status: Home / Self Care | Payer: HMO | Source: Ambulatory Visit | Attending: Orthopedic Surgery | Admitting: Orthopedic Surgery

## 2021-03-01 ENCOUNTER — Other Ambulatory Visit: Payer: Self-pay | Admitting: Obstetrics & Gynecology

## 2021-03-01 ENCOUNTER — Ambulatory Visit (INDEPENDENT_AMBULATORY_CARE_PROVIDER_SITE_OTHER): Payer: HMO

## 2021-03-01 ENCOUNTER — Other Ambulatory Visit: Payer: Self-pay

## 2021-03-01 DIAGNOSIS — Z13820 Encounter for screening for osteoporosis: Secondary | ICD-10-CM

## 2021-03-01 DIAGNOSIS — Z780 Asymptomatic menopausal state: Secondary | ICD-10-CM | POA: Diagnosis not present

## 2021-03-01 DIAGNOSIS — E119 Type 2 diabetes mellitus without complications: Secondary | ICD-10-CM | POA: Diagnosis not present

## 2021-03-01 DIAGNOSIS — Z01812 Encounter for preprocedural laboratory examination: Secondary | ICD-10-CM | POA: Insufficient documentation

## 2021-03-01 LAB — BASIC METABOLIC PANEL
Anion gap: 8 (ref 5–15)
BUN: 14 mg/dL (ref 8–23)
CO2: 27 mmol/L (ref 22–32)
Calcium: 9.5 mg/dL (ref 8.9–10.3)
Chloride: 103 mmol/L (ref 98–111)
Creatinine, Ser: 0.68 mg/dL (ref 0.44–1.00)
GFR, Estimated: >60 mL/min (ref >60)
Glucose, Bld: 138 mg/dL — ABNORMAL HIGH (ref 70–99)
Potassium: 4.6 mmol/L (ref 3.5–5.1)
Sodium: 138 mmol/L (ref 135–145)

## 2021-03-01 NOTE — Progress Notes (Signed)
° ° ° ° °  Enhanced Recovery after Surgery for Orthopedics Enhanced Recovery after Surgery is a protocol used to improve the stress on your body and your recovery after surgery.  Patient Instructions  The night before surgery:  No food after midnight. ONLY clear liquids after midnight  The day of surgery (if you do NOT have diabetes):  Drink ONE (1) Pre-Surgery Clear Ensure as directed.   This drink was given to you during your hospital  pre-op appointment visit. The pre-op nurse will instruct you on the time to drink the  Pre-Surgery Ensure depending on your surgery time. Finish the drink at the designated time by the pre-op nurse.  Nothing else to drink after completing the  Pre-Surgery Clear Ensure.  The day of surgery (if you have diabetes): Drink ONE (1) Gatorade 2 (G2) as directed. This drink was given to you during your hospital  pre-op appointment visit.  The pre-op nurse will instruct you on the time to drink the   Gatorade 2 (G2) depending on your surgery time. Color of the Gatorade may vary. Red is not allowed. Nothing else to drink after completing the  Gatorade 2 (G2).         If you have questions, please contact your surgeons office.    Surgical soap given to patient, verbalizes understanding.

## 2021-03-07 ENCOUNTER — Ambulatory Visit (HOSPITAL_BASED_OUTPATIENT_CLINIC_OR_DEPARTMENT_OTHER)
Admission: RE | Admit: 2021-03-07 | Discharge: 2021-03-07 | Disposition: A | Discharge status: Home / Self Care | Payer: HMO | Attending: Orthopedic Surgery | Admitting: Orthopedic Surgery

## 2021-03-07 ENCOUNTER — Encounter (HOSPITAL_BASED_OUTPATIENT_CLINIC_OR_DEPARTMENT_OTHER)
Admission: RE | Disposition: A | Discharge status: Home / Self Care | Payer: Self-pay | Source: Home / Self Care | Attending: Orthopedic Surgery

## 2021-03-07 ENCOUNTER — Ambulatory Visit (HOSPITAL_BASED_OUTPATIENT_CLINIC_OR_DEPARTMENT_OTHER): Payer: HMO | Admitting: Anesthesiology

## 2021-03-07 ENCOUNTER — Encounter (HOSPITAL_BASED_OUTPATIENT_CLINIC_OR_DEPARTMENT_OTHER): Payer: Self-pay | Admitting: Orthopedic Surgery

## 2021-03-07 ENCOUNTER — Other Ambulatory Visit: Payer: Self-pay

## 2021-03-07 DIAGNOSIS — G5601 Carpal tunnel syndrome, right upper limb: Secondary | ICD-10-CM | POA: Diagnosis not present

## 2021-03-07 DIAGNOSIS — Z7984 Long term (current) use of oral hypoglycemic drugs: Secondary | ICD-10-CM | POA: Insufficient documentation

## 2021-03-07 DIAGNOSIS — M654 Radial styloid tenosynovitis [de Quervain]: Secondary | ICD-10-CM | POA: Diagnosis not present

## 2021-03-07 DIAGNOSIS — I10 Essential (primary) hypertension: Secondary | ICD-10-CM | POA: Insufficient documentation

## 2021-03-07 DIAGNOSIS — E119 Type 2 diabetes mellitus without complications: Secondary | ICD-10-CM | POA: Diagnosis not present

## 2021-03-07 DIAGNOSIS — I447 Left bundle-branch block, unspecified: Secondary | ICD-10-CM | POA: Diagnosis not present

## 2021-03-07 HISTORY — PX: CARPAL TUNNEL RELEASE: SHX101

## 2021-03-07 HISTORY — PX: DORSAL COMPARTMENT RELEASE: SHX5039

## 2021-03-07 LAB — GLUCOSE, CAPILLARY
Glucose-Capillary: 104 mg/dL — ABNORMAL HIGH (ref 70–99)
Glucose-Capillary: 80 mg/dL (ref 70–99)

## 2021-03-07 SURGERY — CARPAL TUNNEL RELEASE
Anesthesia: Monitor Anesthesia Care | Site: Arm Lower | Laterality: Right

## 2021-03-07 MED ORDER — FENTANYL CITRATE (PF) 100 MCG/2ML IJ SOLN
INTRAMUSCULAR | Status: AC
  Filled 2021-03-07: qty 2

## 2021-03-07 MED ORDER — LACTATED RINGERS IV SOLN
INTRAVENOUS | Status: DC
Start: 2021-03-07 — End: 2021-03-07

## 2021-03-07 MED ORDER — BUPIVACAINE HCL (PF) 0.25 % IJ SOLN
INTRAMUSCULAR | Status: DC | PRN
  Administered 2021-03-07: 10 mL

## 2021-03-07 MED ORDER — AMISULPRIDE (ANTIEMETIC) 5 MG/2ML IV SOLN: 10.0000 mg | Freq: Once | INTRAVENOUS | Status: DC | PRN

## 2021-03-07 MED ORDER — OXYCODONE HCL 5 MG/5ML PO SOLN: 5.0000 mg | Freq: Once | ORAL | Status: DC | PRN

## 2021-03-07 MED ORDER — HYDROCODONE-ACETAMINOPHEN 5-325 MG PO TABS: ORAL_TABLET | ORAL | 0 refills | Status: AC

## 2021-03-07 MED ORDER — ONDANSETRON HCL 4 MG/2ML IJ SOLN: 4.0000 mg | Freq: Once | INTRAMUSCULAR | Status: DC | PRN

## 2021-03-07 MED ORDER — MIDAZOLAM HCL 5 MG/5ML IJ SOLN
INTRAMUSCULAR | Status: DC | PRN
  Administered 2021-03-07: 2 mg via INTRAVENOUS

## 2021-03-07 MED ORDER — CEFAZOLIN SODIUM-DEXTROSE 2-4 GM/100ML-% IV SOLN
INTRAVENOUS | Status: AC
  Filled 2021-03-07: qty 100

## 2021-03-07 MED ORDER — OXYCODONE HCL 5 MG PO TABS: 5.0000 mg | ORAL_TABLET | Freq: Once | ORAL | Status: DC | PRN

## 2021-03-07 MED ORDER — CEFAZOLIN SODIUM-DEXTROSE 2-4 GM/100ML-% IV SOLN
2.0000 g | INTRAVENOUS | Status: AC
  Administered 2021-03-07: 2 g via INTRAVENOUS

## 2021-03-07 MED ORDER — 0.9 % SODIUM CHLORIDE (POUR BTL) OPTIME
TOPICAL | Status: DC | PRN
  Administered 2021-03-07: 1000 mL

## 2021-03-07 MED ORDER — ONDANSETRON HCL 4 MG/2ML IJ SOLN
INTRAMUSCULAR | Status: AC
Start: 2021-03-07 — End: ?
  Filled 2021-03-07: qty 2

## 2021-03-07 MED ORDER — LIDOCAINE HCL (PF) 0.5 % IJ SOLN
INTRAMUSCULAR | Status: DC | PRN
  Administered 2021-03-07: 30 mL via INTRAVENOUS

## 2021-03-07 MED ORDER — PROPOFOL 10 MG/ML IV BOLUS
INTRAVENOUS | Status: AC
  Filled 2021-03-07: qty 20

## 2021-03-07 MED ORDER — ACETAMINOPHEN 10 MG/ML IV SOLN: 1000.0000 mg | Freq: Once | INTRAVENOUS | Status: DC | PRN

## 2021-03-07 MED ORDER — MIDAZOLAM HCL 2 MG/2ML IJ SOLN
INTRAMUSCULAR | Status: AC
  Filled 2021-03-07: qty 2

## 2021-03-07 MED ORDER — FENTANYL CITRATE (PF) 100 MCG/2ML IJ SOLN: 25.0000 ug | INTRAMUSCULAR | Status: DC | PRN

## 2021-03-07 MED ORDER — FENTANYL CITRATE (PF) 100 MCG/2ML IJ SOLN
INTRAMUSCULAR | Status: DC | PRN
  Administered 2021-03-07: 50 ug via INTRAVENOUS

## 2021-03-07 MED ORDER — PROPOFOL 500 MG/50ML IV EMUL
INTRAVENOUS | Status: DC | PRN
  Administered 2021-03-07: 75 ug/kg/min via INTRAVENOUS

## 2021-03-07 MED ORDER — ONDANSETRON HCL 4 MG/2ML IJ SOLN
INTRAMUSCULAR | Status: DC | PRN
  Administered 2021-03-07: 4 mg via INTRAVENOUS

## 2021-03-07 SURGICAL SUPPLY — 39 items
APL PRP STRL LF DISP 70% ISPRP (MISCELLANEOUS) ×1
BLADE MINI RND TIP GREEN BEAV (BLADE) IMPLANT
BLADE SURG 15 STRL LF DISP TIS (BLADE) ×4 IMPLANT
BLADE SURG 15 STRL SS (BLADE) ×4
BNDG CMPR 9X4 STRL LF SNTH (GAUZE/BANDAGES/DRESSINGS) ×1
BNDG ELASTIC 3X5.8 VLCR STR LF (GAUZE/BANDAGES/DRESSINGS) ×3 IMPLANT
BNDG ESMARK 4X9 LF (GAUZE/BANDAGES/DRESSINGS) ×1 IMPLANT
BNDG GAUZE ELAST 4 BULKY (GAUZE/BANDAGES/DRESSINGS) ×3 IMPLANT
CHLORAPREP W/TINT 26 (MISCELLANEOUS) ×3 IMPLANT
CORD BIPOLAR FORCEPS 12FT (ELECTRODE) ×3 IMPLANT
COVER BACK TABLE 60X90IN (DRAPES) ×3 IMPLANT
COVER MAYO STAND STRL (DRAPES) ×3 IMPLANT
CUFF TOURN SGL QUICK 18X4 (TOURNIQUET CUFF) ×2 IMPLANT
DRAPE EXTREMITY T 121X128X90 (DISPOSABLE) ×3 IMPLANT
DRAPE SURG 17X23 STRL (DRAPES) ×2 IMPLANT
DRSG PAD ABDOMINAL 8X10 ST (GAUZE/BANDAGES/DRESSINGS) ×3 IMPLANT
GAUZE 4X4 16PLY ~~LOC~~+RFID DBL (SPONGE) ×1 IMPLANT
GAUZE SPONGE 4X4 12PLY STRL (GAUZE/BANDAGES/DRESSINGS) ×3 IMPLANT
GAUZE XEROFORM 1X8 LF (GAUZE/BANDAGES/DRESSINGS) ×3 IMPLANT
GLOVE SRG 8 PF TXTR STRL LF DI (GLOVE) ×2 IMPLANT
GLOVE SURG ENC MOIS LTX SZ7.5 (GLOVE) ×3 IMPLANT
GLOVE SURG POLYISO LF SZ7 (GLOVE) ×1 IMPLANT
GLOVE SURG UNDER POLY LF SZ7 (GLOVE) ×1 IMPLANT
GLOVE SURG UNDER POLY LF SZ8 (GLOVE) ×2
GOWN STRL REUS W/ TWL LRG LVL3 (GOWN DISPOSABLE) ×2 IMPLANT
GOWN STRL REUS W/TWL LRG LVL3 (GOWN DISPOSABLE) ×2
GOWN STRL REUS W/TWL XL LVL3 (GOWN DISPOSABLE) ×3 IMPLANT
NDL HYPO 25X1 1.5 SAFETY (NEEDLE) ×2 IMPLANT
NEEDLE HYPO 25X1 1.5 SAFETY (NEEDLE) ×2 IMPLANT
NS IRRIG 1000ML POUR BTL (IV SOLUTION) ×3 IMPLANT
PACK BASIN DAY SURGERY FS (CUSTOM PROCEDURE TRAY) ×3 IMPLANT
PADDING CAST ABS 4INX4YD NS (CAST SUPPLIES) ×1
PADDING CAST ABS COTTON 4X4 ST (CAST SUPPLIES) ×2 IMPLANT
STOCKINETTE 4X48 STRL (DRAPES) ×3 IMPLANT
SUT ETHILON 4 0 PS 2 18 (SUTURE) ×4 IMPLANT
SYR BULB EAR ULCER 3OZ GRN STR (SYRINGE) ×3 IMPLANT
SYR CONTROL 10ML LL (SYRINGE) ×3 IMPLANT
TOWEL GREEN STERILE FF (TOWEL DISPOSABLE) ×6 IMPLANT
UNDERPAD 30X36 HEAVY ABSORB (UNDERPADS AND DIAPERS) ×3 IMPLANT

## 2021-03-07 NOTE — Anesthesia Postprocedure Evaluation (Signed)
Anesthesia Post Note  Patient: Angela Choi  Procedure(s) Performed: CARPAL TUNNEL RELEASE RIGHT (Right: Arm Lower) RIGHT 1ST DORSAL COMPARTMENT RELEASE (Right: Arm Lower)     Patient location during evaluation: PACU Anesthesia Type: MAC Level of consciousness: awake and alert Pain management: pain level controlled Vital Signs Assessment: post-procedure vital signs reviewed and stable Respiratory status: spontaneous breathing and respiratory function stable Cardiovascular status: stable Postop Assessment: no apparent nausea or vomiting Anesthetic complications: no   No notable events documented.  Last Vitals:  Vitals:   03/07/21 1430 03/07/21 1454  BP: (!) 182/79 (!) 168/85  Pulse: 70 78  Resp: 15 16  Temp:  36.6 C  SpO2: 96% 96%    Last Pain:  Vitals:   03/07/21 1442  TempSrc:   PainSc: 0-No pain                 Merlinda Frederick

## 2021-03-07 NOTE — Discharge Instructions (Addendum)

## 2021-03-07 NOTE — H&P (Signed)
Angela Choi is an 71 y.o. female.   Chief Complaint: right carpal tunnel, right dequervains HPI: 71 yo female with numbness and tingling right hand and pain at radial side of wrist.  Positive nerve conduction studies.  Nocturnal symptoms.  She wishes to have right carpal tunnel release and right first dorsal compartment release.  Allergies:  Allergies  Allergen Reactions   Biaxin [Clarithromycin] Hives and Swelling   Metformin And Related Diarrhea    Past Medical History:  Diagnosis Date   Diabetes mellitus    Elevated cholesterol    Endometriosis    Family history of adverse reaction to anesthesia    yonger sister with PONV   Fibroid    Hypertension    Left bundle branch block (LBBB)    Rectocele    Uterine prolapse     Past Surgical History:  Procedure Laterality Date   BLADDER SUSPENSION     Sling   GANGLION CYST EXCISION Right 09/23/2020   Procedure: RIGHT WRIST EXCISION VOLAR GANGLION;  Surgeon: Leanora Cover, MD;  Location: [facility];  Service: Orthopedics;  Laterality: Right;   PELVIC LAPAROSCOPY     VAGINAL HYSTERECTOMY     Vag Hyst Post repair    Family History: Family History  Problem Relation Age of Onset   Diabetes Father    Heart disease Father        died 82 y/o   Diabetes Sister    Hypertension Sister    Uterine cancer Sister    Cancer Sister 39       uterine   Colon cancer Maternal Aunt    Cancer Maternal Aunt        colon   Gastric cancer Maternal Aunt    Hypertension Maternal Grandmother    Hypertension Maternal Grandfather    Hypertension Paternal Grandmother    Hypertension Paternal Grandfather    Breast cancer Neg Hx     Social History:   reports that she has never smoked. She has never used smokeless tobacco. She reports current alcohol use. She reports that she does not use drugs.  Medications: Medications Prior to Admission  Medication Sig Dispense Refill   atorvastatin (LIPITOR) 40 MG tablet Take 40 mg by mouth  daily.     cholecalciferol (VITAMIN D) 1000 UNITS tablet Take 2 Units by mouth daily.     empagliflozin (JARDIANCE) 25 MG TABS tablet Take 1 tablet by mouth daily.     FLUoxetine (PROZAC) 20 MG capsule Take 20 mg by mouth daily.     glimepiride (AMARYL) 4 MG tablet Take 4 mg by mouth daily with breakfast.     hydrochlorothiazide (MICROZIDE) 12.5 MG capsule Take 12.5 mg by mouth daily.     lisinopril (ZESTRIL) 40 MG tablet 1 tablet     Multiple Vitamin (MULTIVITAMIN) tablet Take 1 tablet by mouth daily.     quinapril (ACCUPRIL) 40 MG tablet Take 40 mg by mouth at bedtime.      Results for orders placed or performed during the hospital encounter of 03/07/21 (from the past 48 hour(s))  Glucose, capillary     Status: Abnormal   Collection Time: 03/07/21 11:46 AM  Result Value Ref Range   Glucose-Capillary 104 (H) 70 - 99 mg/dL    Comment: Glucose reference range applies only to samples taken after fasting for at least 8 hours.    No results found.    Blood pressure 139/64, pulse 71, temperature 98.1 F (36.7 C), temperature source Oral, resp.  rate 16, height 5\' 6"  (1.676 m), weight 76.9 kg, SpO2 100 %.  General appearance: alert, cooperative, and appears stated age Head: Normocephalic, without obvious abnormality, atraumatic Neck: supple, symmetrical, trachea midline Extremities: Intact sensation and capillary refill all digits.  +epl/fpl/io.  No wounds.  Pulses: 2+ and symmetric Skin: Skin color, texture, turgor normal. No rashes or lesions Neurologic: Grossly normal Incision/Wound: none  Assessment/Plan Right carpal tunnel syndrome and right dequervains tenosynovitis.  Non operative and operative treatment options have been discussed with the patient and patient wishes to proceed with operative treatment. Risks, benefits, and alternatives of surgery have been discussed and the patient agrees with the plan of care.   Leanora Cover 03/07/2021, 12:56 PM

## 2021-03-07 NOTE — Anesthesia Procedure Notes (Signed)
Procedure Name: MAC Date/Time: 03/07/2021 1:24 PM Performed by: Glory Buff, CRNA Oxygen Delivery Method: Simple face mask

## 2021-03-07 NOTE — Op Note (Signed)
03/07/2021 [facility]                              OPERATIVE REPORT   PREOPERATIVE DIAGNOSIS: 1.  Right carpal tunnel syndrome 2.  Right de Quervain's tenosynovitis  POSTOPERATIVE DIAGNOSIS: 1.  Right carpal tunnel syndrome 2.  Right de Quervain's tenosynovitis  PROCEDURE: 1.  Right carpal tunnel release 2.  Right first dorsal compartment release via separate incision  SURGEON:  Leanora Cover, MD  ASSISTANT:  none.  ANESTHESIA: Bier block with sedation  IV FLUIDS:  Per anesthesia flow sheet.  ESTIMATED BLOOD LOSS:  Minimal.  COMPLICATIONS:  None.  SPECIMENS:  None.  TOURNIQUET TIME:    Total Tourniquet Time Documented: Forearm (Right) - 32 minutes Total: Forearm (Right) - 32 minutes   DISPOSITION:  Stable to PACU.  LOCATION: [facility]  INDICATIONS: 71 year old female with numbness and tingling right hand.  Nocturnal symptoms.  Positive nerve conduction studies.  She also has right de Quervain's tenosynovitis.  She wishes to have a carpal tunnel release and right first dorsal compartment release for management of her symptoms.  Risks, benefits and alternatives of surgery were discussed including the risk of blood loss; infection; damage to nerves, vessels, tendons, ligaments, bone; failure of surgery; need for additional surgery; complications with wound healing; continued pain; recurrence of carpal tunnel syndrome; and damage to motor branch. She voiced understanding of these risks and elected to proceed.   OPERATIVE COURSE:  After being identified preoperatively by myself, the patient and I agreed upon the procedure and site of procedure.  The surgical site was marked.  Surgical consent had been signed.  She was given preoperative IV antibiotic prophylaxis.  She was transferred to the operating room and placed on the operating room table in supine position with the right upper extremity on an armboard.  Bier block anesthesia was induced by the  anesthesiologist.  Right upper extremity was prepped and draped in normal sterile orthopaedic fashion.  A surgical pause was performed between the surgeons, anesthesia, and operating room staff, and all were in agreement as to the patient, procedure, and site of procedure.  Tourniquet at the proximal aspect of the forearm had been inflated for the Bier block  Incision was made over the transverse carpal ligament and carried into the subcutaneous tissues by spreading technique.  Bipolar electrocautery was used to obtain hemostasis.  The palmar fascia was sharply incised.  The transverse carpal ligament was identified and sharply incised.  It was incised distally first.  The flexor tendons were identified.  The flexor tendon to the ring finger was identified and retracted radially.  The transverse carpal ligament was then incised proximally.  Scissors were used to split the distal aspect of the volar antebrachial fascia.  A finger was placed into the wound to ensure complete decompression, which was the case.  The nerve was examined.  It was adherent to the radial leaflet.  The motor branch was identified and was intact.  The wound was copiously irrigated with sterile saline.  It was then closed with 4-0 nylon in a horizontal mattress fashion.  Incision was then made longitudinally over the first dorsal compartment at the radial side of the wrist.  This was made as far away from her previous volar ganglion incision as possible.  The first dorsal compartment was identified.  It was sharply released under direct visualization.  Superficial branch of the radial  nerve was identified and protected throughout the case.  The fascia was released proximally.  There was a separate compartment distally for the EPB tendon.  This was released as well.  There was a tight fascial band surrounding the APL tendon out toward the base of the thumb metacarpal which was released.  The wound was copiously irrigated with sterile saline.   With extension the tendon started to subluxate out of the compartment.  The wounds were injected with 0.25% plain Marcaine to aid in postoperative analgesia.  They were dressed with sterile Xeroform, 4x4s, an ABD, and wrapped with Kerlix and an Ace bandage.  Tourniquet was deflated at 32 minutes.  Fingertips were pink with brisk capillary refill after deflation of the tourniquet.  Operative drapes were broken down.  The patient was awoken from anesthesia safely.  She was transferred back to stretcher and taken to the PACU in stable condition.  I will see her back in the office in 1 week for postoperative followup.  I will give her a prescription for Norco 5/325 1-2 tabs PO q6 hours prn pain, dispense # 15.    Leanora Cover, MD Electronically signed, 03/07/21

## 2021-03-07 NOTE — Anesthesia Preprocedure Evaluation (Addendum)
Anesthesia Evaluation  Patient identified by MRN, date of birth, ID band Patient awake    Reviewed: Allergy & Precautions, NPO status , Patient's Chart, lab work & pertinent test results  Airway Mallampati: II  TM Distance: >3 FB Neck ROM: Full    Dental no notable dental hx.    Pulmonary neg pulmonary ROS,    Pulmonary exam normal breath sounds clear to auscultation       Cardiovascular hypertension, Pt. on medications Normal cardiovascular exam+ dysrhythmias (LBBB)  Rhythm:Regular Rate:Normal     Neuro/Psych negative neurological ROS  negative psych ROS   GI/Hepatic negative GI ROS, Neg liver ROS,   Endo/Other  negative endocrine ROSdiabetes, Well Controlled, Type 2, Oral Hypoglycemic Agents  Renal/GU negative Renal ROS  negative genitourinary   Musculoskeletal negative musculoskeletal ROS (+)   Abdominal   Peds negative pediatric ROS (+)  Hematology negative hematology ROS (+)   Anesthesia Other Findings   Reproductive/Obstetrics negative OB ROS                             Anesthesia Physical Anesthesia Plan  ASA: 3  Anesthesia Plan: MAC and Bier Block and Bier Block-LIDOCAINE ONLY   Post-op Pain Management: Minimal or no pain anticipated   Induction: Intravenous  PONV Risk Score and Plan: 2 and Propofol infusion, TIVA, Treatment may vary due to age or medical condition and Ondansetron  Airway Management Planned: Natural Airway and Simple Face Mask  Additional Equipment: None  Intra-op Plan:   Post-operative Plan:   Informed Consent: I have reviewed the patients History and Physical, chart, labs and discussed the procedure including the risks, benefits and alternatives for the proposed anesthesia with the patient or authorized representative who has indicated his/her understanding and acceptance.     Dental advisory given  Plan Discussed with: CRNA, Anesthesiologist  and Surgeon  Anesthesia Plan Comments: (Bier block. GA/LMA as backup plan. Norton Blizzard, MD  )        Anesthesia Quick Evaluation

## 2021-03-07 NOTE — Transfer of Care (Signed)
Immediate Anesthesia Transfer of Care Note  Patient: Angela Choi  Procedure(s) Performed: CARPAL TUNNEL RELEASE RIGHT (Right: Arm Lower) RIGHT 1ST DORSAL COMPARTMENT RELEASE (Right: Arm Lower)  Patient Location: PACU  Anesthesia Type:MAC  Level of Consciousness: awake, alert , oriented and patient cooperative  Airway & Oxygen Therapy: Patient Spontanous Breathing and Patient connected to face mask oxygen  Post-op Assessment: Report given to RN and Post -op Vital signs reviewed and stable  Post vital signs: Reviewed and stable  Last Vitals:  Vitals Value Taken Time  BP    Temp    Pulse 78 03/07/21 1412  Resp    SpO2 98 % 03/07/21 1412  Vitals shown include unvalidated device data.  Last Pain:  Vitals:   03/07/21 1141  TempSrc: Oral  PainSc: 0-No pain         Complications: No notable events documented.

## 2021-03-08 ENCOUNTER — Encounter (HOSPITAL_BASED_OUTPATIENT_CLINIC_OR_DEPARTMENT_OTHER): Payer: Self-pay | Admitting: Orthopedic Surgery

## 2021-05-05 DIAGNOSIS — E1169 Type 2 diabetes mellitus with other specified complication: Secondary | ICD-10-CM | POA: Diagnosis not present

## 2021-05-10 DIAGNOSIS — Z6827 Body mass index (BMI) 27.0-27.9, adult: Secondary | ICD-10-CM | POA: Diagnosis not present

## 2021-05-10 DIAGNOSIS — E1169 Type 2 diabetes mellitus with other specified complication: Secondary | ICD-10-CM | POA: Diagnosis not present

## 2021-05-10 DIAGNOSIS — E663 Overweight: Secondary | ICD-10-CM | POA: Diagnosis not present

## 2021-08-09 DIAGNOSIS — E1169 Type 2 diabetes mellitus with other specified complication: Secondary | ICD-10-CM | POA: Diagnosis not present

## 2022-01-19 DIAGNOSIS — L820 Inflamed seborrheic keratosis: Secondary | ICD-10-CM | POA: Diagnosis not present

## 2022-01-19 DIAGNOSIS — L814 Other melanin hyperpigmentation: Secondary | ICD-10-CM | POA: Diagnosis not present

## 2022-01-19 DIAGNOSIS — L821 Other seborrheic keratosis: Secondary | ICD-10-CM | POA: Diagnosis not present

## 2022-01-19 DIAGNOSIS — D485 Neoplasm of uncertain behavior of skin: Secondary | ICD-10-CM | POA: Diagnosis not present

## 2022-01-19 DIAGNOSIS — L080 Pyoderma: Secondary | ICD-10-CM | POA: Diagnosis not present

## 2022-01-25 DIAGNOSIS — H2513 Age-related nuclear cataract, bilateral: Secondary | ICD-10-CM | POA: Diagnosis not present

## 2022-01-25 DIAGNOSIS — H35033 Hypertensive retinopathy, bilateral: Secondary | ICD-10-CM | POA: Diagnosis not present

## 2022-01-25 DIAGNOSIS — H524 Presbyopia: Secondary | ICD-10-CM | POA: Diagnosis not present

## 2022-01-25 DIAGNOSIS — I10 Essential (primary) hypertension: Secondary | ICD-10-CM | POA: Diagnosis not present

## 2022-01-25 DIAGNOSIS — H40053 Ocular hypertension, bilateral: Secondary | ICD-10-CM | POA: Diagnosis not present

## 2022-01-25 DIAGNOSIS — E119 Type 2 diabetes mellitus without complications: Secondary | ICD-10-CM | POA: Diagnosis not present

## 2022-01-25 DIAGNOSIS — H5203 Hypermetropia, bilateral: Secondary | ICD-10-CM | POA: Diagnosis not present

## 2022-01-25 DIAGNOSIS — H52223 Regular astigmatism, bilateral: Secondary | ICD-10-CM | POA: Diagnosis not present

## 2022-01-31 ENCOUNTER — Other Ambulatory Visit: Payer: Self-pay | Admitting: Obstetrics & Gynecology

## 2022-01-31 DIAGNOSIS — Z1231 Encounter for screening mammogram for malignant neoplasm of breast: Secondary | ICD-10-CM

## 2022-02-03 ENCOUNTER — Ambulatory Visit: Payer: HMO

## 2022-02-03 ENCOUNTER — Ambulatory Visit
Admission: RE | Admit: 2022-02-03 | Discharge: 2022-02-03 | Disposition: A | Discharge status: Home / Self Care | Payer: HMO | Source: Ambulatory Visit | Attending: Obstetrics & Gynecology | Admitting: Obstetrics & Gynecology

## 2022-02-03 DIAGNOSIS — Z1231 Encounter for screening mammogram for malignant neoplasm of breast: Secondary | ICD-10-CM

## 2022-02-24 DIAGNOSIS — M62830 Muscle spasm of back: Secondary | ICD-10-CM | POA: Diagnosis not present

## 2022-02-24 DIAGNOSIS — Z6826 Body mass index (BMI) 26.0-26.9, adult: Secondary | ICD-10-CM | POA: Diagnosis not present

## 2022-03-13 ENCOUNTER — Encounter: Payer: Self-pay | Admitting: Obstetrics & Gynecology

## 2022-03-27 ENCOUNTER — Ambulatory Visit: Payer: HMO | Admitting: Obstetrics & Gynecology

## 2022-05-09 DIAGNOSIS — E1169 Type 2 diabetes mellitus with other specified complication: Secondary | ICD-10-CM | POA: Diagnosis not present

## 2022-05-10 DIAGNOSIS — I10 Essential (primary) hypertension: Secondary | ICD-10-CM | POA: Diagnosis not present

## 2022-05-10 DIAGNOSIS — F332 Major depressive disorder, recurrent severe without psychotic features: Secondary | ICD-10-CM | POA: Diagnosis not present

## 2022-05-10 DIAGNOSIS — E7800 Pure hypercholesterolemia, unspecified: Secondary | ICD-10-CM | POA: Diagnosis not present

## 2022-05-10 DIAGNOSIS — E1136 Type 2 diabetes mellitus with diabetic cataract: Secondary | ICD-10-CM | POA: Diagnosis not present

## 2022-05-10 DIAGNOSIS — R140 Abdominal distension (gaseous): Secondary | ICD-10-CM | POA: Diagnosis not present

## 2022-06-07 DIAGNOSIS — F339 Major depressive disorder, recurrent, unspecified: Secondary | ICD-10-CM | POA: Diagnosis not present

## 2022-06-07 DIAGNOSIS — Z6826 Body mass index (BMI) 26.0-26.9, adult: Secondary | ICD-10-CM | POA: Diagnosis not present

## 2022-06-07 DIAGNOSIS — F419 Anxiety disorder, unspecified: Secondary | ICD-10-CM | POA: Diagnosis not present

## 2022-06-07 DIAGNOSIS — M4727 Other spondylosis with radiculopathy, lumbosacral region: Secondary | ICD-10-CM | POA: Diagnosis not present

## 2022-07-26 DIAGNOSIS — H524 Presbyopia: Secondary | ICD-10-CM | POA: Diagnosis not present

## 2022-07-26 DIAGNOSIS — H52223 Regular astigmatism, bilateral: Secondary | ICD-10-CM | POA: Diagnosis not present

## 2022-07-26 DIAGNOSIS — H5203 Hypermetropia, bilateral: Secondary | ICD-10-CM | POA: Diagnosis not present

## 2022-07-26 DIAGNOSIS — H2513 Age-related nuclear cataract, bilateral: Secondary | ICD-10-CM | POA: Diagnosis not present

## 2022-10-20 DIAGNOSIS — H2513 Age-related nuclear cataract, bilateral: Secondary | ICD-10-CM | POA: Diagnosis not present

## 2022-10-20 DIAGNOSIS — H18413 Arcus senilis, bilateral: Secondary | ICD-10-CM | POA: Diagnosis not present

## 2022-10-20 DIAGNOSIS — H25013 Cortical age-related cataract, bilateral: Secondary | ICD-10-CM | POA: Diagnosis not present

## 2022-10-20 DIAGNOSIS — H2511 Age-related nuclear cataract, right eye: Secondary | ICD-10-CM | POA: Diagnosis not present

## 2022-10-20 DIAGNOSIS — H25043 Posterior subcapsular polar age-related cataract, bilateral: Secondary | ICD-10-CM | POA: Diagnosis not present

## 2022-11-07 DIAGNOSIS — E7800 Pure hypercholesterolemia, unspecified: Secondary | ICD-10-CM | POA: Diagnosis not present

## 2022-11-07 DIAGNOSIS — E1169 Type 2 diabetes mellitus with other specified complication: Secondary | ICD-10-CM | POA: Diagnosis not present

## 2022-11-08 DIAGNOSIS — E7800 Pure hypercholesterolemia, unspecified: Secondary | ICD-10-CM | POA: Diagnosis not present

## 2022-11-08 DIAGNOSIS — F3340 Major depressive disorder, recurrent, in remission, unspecified: Secondary | ICD-10-CM | POA: Diagnosis not present

## 2022-11-08 DIAGNOSIS — Z0000 Encounter for general adult medical examination without abnormal findings: Secondary | ICD-10-CM | POA: Diagnosis not present

## 2022-11-08 DIAGNOSIS — I10 Essential (primary) hypertension: Secondary | ICD-10-CM | POA: Diagnosis not present

## 2022-11-08 DIAGNOSIS — E1136 Type 2 diabetes mellitus with diabetic cataract: Secondary | ICD-10-CM | POA: Diagnosis not present

## 2022-11-08 DIAGNOSIS — M79622 Pain in left upper arm: Secondary | ICD-10-CM | POA: Diagnosis not present

## 2022-11-08 DIAGNOSIS — Z1331 Encounter for screening for depression: Secondary | ICD-10-CM | POA: Diagnosis not present

## 2022-11-08 DIAGNOSIS — Z6826 Body mass index (BMI) 26.0-26.9, adult: Secondary | ICD-10-CM | POA: Diagnosis not present

## 2022-11-09 ENCOUNTER — Other Ambulatory Visit: Payer: Self-pay | Admitting: Family Medicine

## 2022-11-09 DIAGNOSIS — M79622 Pain in left upper arm: Secondary | ICD-10-CM

## 2022-11-21 ENCOUNTER — Ambulatory Visit
Admission: RE | Admit: 2022-11-21 | Discharge: 2022-11-21 | Disposition: A | Discharge status: Home / Self Care | Payer: PPO | Source: Ambulatory Visit | Attending: Family Medicine | Admitting: Family Medicine

## 2022-11-21 ENCOUNTER — Ambulatory Visit
Admission: RE | Admit: 2022-11-21 | Discharge: 2022-11-21 | Disposition: A | Discharge status: Home / Self Care | Payer: HMO | Source: Ambulatory Visit | Attending: Family Medicine | Admitting: Family Medicine

## 2022-11-21 DIAGNOSIS — M79622 Pain in left upper arm: Secondary | ICD-10-CM

## 2022-11-21 DIAGNOSIS — N6459 Other signs and symptoms in breast: Secondary | ICD-10-CM | POA: Diagnosis not present

## 2022-11-29 DIAGNOSIS — M25551 Pain in right hip: Secondary | ICD-10-CM | POA: Diagnosis not present

## 2022-12-25 DIAGNOSIS — H2511 Age-related nuclear cataract, right eye: Secondary | ICD-10-CM | POA: Diagnosis not present

## 2022-12-26 DIAGNOSIS — H2512 Age-related nuclear cataract, left eye: Secondary | ICD-10-CM | POA: Diagnosis not present

## 2023-01-08 DIAGNOSIS — H2512 Age-related nuclear cataract, left eye: Secondary | ICD-10-CM | POA: Diagnosis not present

## 2023-05-02 DIAGNOSIS — E1169 Type 2 diabetes mellitus with other specified complication: Secondary | ICD-10-CM | POA: Diagnosis not present

## 2023-05-29 ENCOUNTER — Other Ambulatory Visit: Payer: Self-pay | Admitting: Family Medicine

## 2023-05-29 DIAGNOSIS — Z0000 Encounter for general adult medical examination without abnormal findings: Secondary | ICD-10-CM

## 2023-05-30 ENCOUNTER — Ambulatory Visit
Admission: RE | Admit: 2023-05-30 | Discharge: 2023-05-30 | Disposition: A | Discharge status: Home / Self Care | Source: Ambulatory Visit | Attending: Family Medicine | Admitting: Family Medicine

## 2023-05-30 DIAGNOSIS — Z0000 Encounter for general adult medical examination without abnormal findings: Secondary | ICD-10-CM

## 2023-05-30 DIAGNOSIS — Z1231 Encounter for screening mammogram for malignant neoplasm of breast: Secondary | ICD-10-CM | POA: Diagnosis not present

## 2023-08-13 DIAGNOSIS — E119 Type 2 diabetes mellitus without complications: Secondary | ICD-10-CM | POA: Diagnosis not present

## 2023-08-13 DIAGNOSIS — I10 Essential (primary) hypertension: Secondary | ICD-10-CM | POA: Diagnosis not present

## 2023-08-13 DIAGNOSIS — K648 Other hemorrhoids: Secondary | ICD-10-CM | POA: Diagnosis not present

## 2023-08-13 DIAGNOSIS — E663 Overweight: Secondary | ICD-10-CM | POA: Diagnosis not present

## 2023-08-13 DIAGNOSIS — E7800 Pure hypercholesterolemia, unspecified: Secondary | ICD-10-CM | POA: Diagnosis not present

## 2023-08-13 DIAGNOSIS — Z6827 Body mass index (BMI) 27.0-27.9, adult: Secondary | ICD-10-CM | POA: Diagnosis not present

## 2023-08-13 DIAGNOSIS — F325 Major depressive disorder, single episode, in full remission: Secondary | ICD-10-CM | POA: Diagnosis not present

## 2023-08-13 DIAGNOSIS — M4727 Other spondylosis with radiculopathy, lumbosacral region: Secondary | ICD-10-CM | POA: Diagnosis not present

## 2023-11-21 DIAGNOSIS — F3341 Major depressive disorder, recurrent, in partial remission: Secondary | ICD-10-CM | POA: Diagnosis not present

## 2023-11-21 DIAGNOSIS — Z0000 Encounter for general adult medical examination without abnormal findings: Secondary | ICD-10-CM | POA: Diagnosis not present

## 2023-11-21 DIAGNOSIS — E663 Overweight: Secondary | ICD-10-CM | POA: Diagnosis not present

## 2023-11-21 DIAGNOSIS — E119 Type 2 diabetes mellitus without complications: Secondary | ICD-10-CM | POA: Diagnosis not present

## 2023-11-21 DIAGNOSIS — S93492A Sprain of other ligament of left ankle, initial encounter: Secondary | ICD-10-CM | POA: Diagnosis not present
